# Patient Record
Sex: Female | Born: 1990
Health system: Southern US, Community
[De-identification: ages and names within clinical notes are randomized; demographics above are authoritative.]

## PROBLEM LIST (undated history)

## (undated) DIAGNOSIS — F1410 Cocaine abuse, uncomplicated: Secondary | ICD-10-CM

## (undated) DIAGNOSIS — D649 Anemia, unspecified: Secondary | ICD-10-CM

## (undated) DIAGNOSIS — Z789 Other specified health status: Secondary | ICD-10-CM

## (undated) HISTORY — DX: Anemia, unspecified: D64.9

## (undated) HISTORY — DX: Cocaine abuse, uncomplicated: F14.10

## (undated) HISTORY — DX: Other specified health status: Z78.9

## (undated) HISTORY — PX: NO PAST SURGERIES: SHX2092

---
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
## 2010-02-21 ENCOUNTER — Emergency Department (HOSPITAL_BASED_OUTPATIENT_CLINIC_OR_DEPARTMENT_OTHER): Admission: EM | Admit: 2010-02-21 | Discharge: 2010-02-21 | Payer: Self-pay | Admitting: Emergency Medicine

## 2011-01-23 LAB — URINE MICROSCOPIC-ADD ON

## 2011-01-23 LAB — BASIC METABOLIC PANEL
BUN: 8 mg/dL (ref 6–23)
CO2: 26 mEq/L (ref 19–32)
Calcium: 9.4 mg/dL (ref 8.4–10.5)
Chloride: 106 mEq/L (ref 96–112)
Creatinine, Ser: 0.8 mg/dL (ref 0.4–1.2)
Glucose, Bld: 92 mg/dL (ref 70–99)

## 2011-01-23 LAB — DIFFERENTIAL
Basophils Absolute: 0.1 10*3/uL (ref 0.0–0.1)
Basophils Relative: 1 % (ref 0–1)
Eosinophils Absolute: 0.1 10*3/uL (ref 0.0–0.7)
Monocytes Relative: 6 % (ref 3–12)
Neutro Abs: 5.1 10*3/uL (ref 1.7–7.7)
Neutrophils Relative %: 67 % (ref 43–77)

## 2011-01-23 LAB — POCT TOXICOLOGY PANEL

## 2011-01-23 LAB — CBC
MCHC: 31.9 g/dL (ref 30.0–36.0)
MCV: 76.1 fL — ABNORMAL LOW (ref 78.0–100.0)
Platelets: 280 10*3/uL (ref 150–400)
RDW: 17.5 % — ABNORMAL HIGH (ref 11.5–15.5)

## 2011-01-23 LAB — URINALYSIS, ROUTINE W REFLEX MICROSCOPIC
Glucose, UA: NEGATIVE mg/dL
Ketones, ur: NEGATIVE mg/dL
Nitrite: NEGATIVE
Specific Gravity, Urine: 1.017 (ref 1.005–1.030)
pH: 6.5 (ref 5.0–8.0)

## 2011-07-27 LAB — DIFFERENTIAL
Lymphocytes Relative: 24
Monocytes Absolute: 0.4
Monocytes Relative: 11
Neutro Abs: 2.1

## 2011-07-27 LAB — CBC
HCT: 39
Hemoglobin: 13.6
MCHC: 34.9
RBC: 4.8

## 2011-07-27 LAB — BASIC METABOLIC PANEL
CO2: 20
Calcium: 9.2
Potassium: 3.6
Sodium: 133 — ABNORMAL LOW

## 2011-11-22 ENCOUNTER — Emergency Department (HOSPITAL_BASED_OUTPATIENT_CLINIC_OR_DEPARTMENT_OTHER)
Admission: EM | Admit: 2011-11-22 | Discharge: 2011-11-22 | Disposition: A | Discharge status: Home / Self Care | Payer: Self-pay | Attending: Emergency Medicine | Admitting: Emergency Medicine

## 2011-11-22 ENCOUNTER — Encounter (HOSPITAL_BASED_OUTPATIENT_CLINIC_OR_DEPARTMENT_OTHER): Payer: Self-pay | Admitting: Emergency Medicine

## 2011-11-22 DIAGNOSIS — R109 Unspecified abdominal pain: Secondary | ICD-10-CM | POA: Insufficient documentation

## 2011-11-22 DIAGNOSIS — H9209 Otalgia, unspecified ear: Secondary | ICD-10-CM | POA: Insufficient documentation

## 2011-11-22 DIAGNOSIS — A499 Bacterial infection, unspecified: Secondary | ICD-10-CM | POA: Insufficient documentation

## 2011-11-22 DIAGNOSIS — F17200 Nicotine dependence, unspecified, uncomplicated: Secondary | ICD-10-CM | POA: Insufficient documentation

## 2011-11-22 DIAGNOSIS — B9689 Other specified bacterial agents as the cause of diseases classified elsewhere: Secondary | ICD-10-CM | POA: Insufficient documentation

## 2011-11-22 DIAGNOSIS — N760 Acute vaginitis: Secondary | ICD-10-CM | POA: Insufficient documentation

## 2011-11-22 LAB — COMPREHENSIVE METABOLIC PANEL
ALT: 23 U/L (ref 0–35)
Alkaline Phosphatase: 56 U/L (ref 39–117)
CO2: 25 mEq/L (ref 19–32)
GFR calc Af Amer: >90 mL/min (ref >90)
Glucose, Bld: 90 mg/dL (ref 70–99)
Potassium: 4 mEq/L (ref 3.5–5.1)
Sodium: 141 mEq/L (ref 135–145)
Total Protein: 6.8 g/dL (ref 6.0–8.3)

## 2011-11-22 LAB — URINE MICROSCOPIC-ADD ON

## 2011-11-22 LAB — CBC
Platelets: 226 10*3/uL (ref 150–400)
RBC: 4.94 MIL/uL (ref 3.87–5.11)
WBC: 7.5 10*3/uL (ref 4.0–10.5)

## 2011-11-22 LAB — URINALYSIS, ROUTINE W REFLEX MICROSCOPIC
Glucose, UA: NEGATIVE mg/dL
Specific Gravity, Urine: 1.019 (ref 1.005–1.030)

## 2011-11-22 LAB — DIFFERENTIAL
Eosinophils Absolute: 0.1 10*3/uL (ref 0.0–0.7)
Lymphocytes Relative: 29 % (ref 12–46)
Lymphs Abs: 2.1 10*3/uL (ref 0.7–4.0)
Neutrophils Relative %: 63 % (ref 43–77)

## 2011-11-22 LAB — WET PREP, GENITAL

## 2011-11-22 MED ORDER — PANTOPRAZOLE SODIUM 20 MG PO TBEC: 20.0000 mg | DELAYED_RELEASE_TABLET | Freq: Every day | ORAL | Status: DC

## 2011-11-22 MED ORDER — METRONIDAZOLE 500 MG PO TABS: 500.0000 mg | ORAL_TABLET | Freq: Two times a day (BID) | ORAL | Status: AC

## 2011-11-22 MED ORDER — DICYCLOMINE HCL 20 MG PO TABS: 20.0000 mg | ORAL_TABLET | Freq: Two times a day (BID) | ORAL | Status: DC

## 2011-11-22 MED ORDER — AMOXICILLIN 500 MG PO CAPS: 500.0000 mg | ORAL_CAPSULE | Freq: Three times a day (TID) | ORAL | Status: AC

## 2011-11-22 MED ORDER — SODIUM CHLORIDE 0.9 % IV BOLUS (SEPSIS)
1000.0000 mL | Freq: Once | INTRAVENOUS | Status: AC
  Administered 2011-11-22: 1000 mL via INTRAVENOUS

## 2011-11-22 MED ORDER — GI COCKTAIL ~~LOC~~
30.0000 mL | Freq: Once | ORAL | Status: AC
  Administered 2011-11-22: 30 mL via ORAL
  Filled 2011-11-22: qty 30

## 2011-11-22 MED ORDER — PANTOPRAZOLE SODIUM 40 MG IV SOLR
40.0000 mg | Freq: Once | INTRAVENOUS | Status: AC
  Administered 2011-11-22: 40 mg via INTRAVENOUS
  Filled 2011-11-22: qty 40

## 2011-11-22 NOTE — ED Notes (Signed)
abd pain x 3 days  Denies urinary sx    Denies vag d/c  lmp  1/14

## 2011-11-22 NOTE — ED Provider Notes (Signed)
History     CSN: [id number]  Arrival date & time 11/22/11  1430   3:25 PM HPI This reports abdominal pain for last 3 days. Reports abdominal pain migrates. States sometimes her pain is in her left lower quadrant and sometimes it is located in her epigastrium. Associated with nausea and vomiting yesterday. Denies nausea vomiting or diarrhea today. Denies fever. Reports she is currently on her menstrual cycle. States usually she'll have abdominal cramping with menstrual cycle about the last 1 day. Also states she is here for ear pain that she's had for over one month. States it's gradually worsening. Reports significant history of a ruptured eardrum several months ago and has not followed up with a doctor since. Denies sore throat, ear drainage, hearing loss, neck pain. Patient is a 21 y.o. female presenting with abdominal pain and ear pain. The history is provided by the patient.  Abdominal Pain The primary symptoms of the illness include abdominal pain, nausea, vomiting and vaginal bleeding (Menstrual cycle began 11/19/11). The primary symptoms of the illness do not include fever, shortness of breath, diarrhea, hematemesis, dysuria or vaginal discharge. Episode onset: 3 days. The onset of the illness was gradual. The problem has been gradually worsening.  The pain came on gradually. The abdominal pain is generalized. The abdominal pain does not radiate. The severity of the abdominal pain is 9/10. The abdominal pain is relieved by certain positions.  The patient states that she believes she is currently not pregnant. Symptoms associated with the illness do not include chills, heartburn, urgency, hematuria, frequency or back pain.  Otalgia This is a chronic problem. Episode onset: over 1 month. There is pain in the right ear. The problem occurs constantly. The problem has been gradually worsening. There has been no fever. The pain is severe. Associated symptoms include headaches, abdominal pain and  vomiting. Pertinent negatives include no ear discharge, no rhinorrhea, no sore throat, no diarrhea, no neck pain and no cough.    History reviewed. No pertinent past medical history.  History reviewed. No pertinent past surgical history.  No family history on file.  History  Substance Use Topics  . Smoking status: Current Everyday Smoker    Types: Cigarettes  . Smokeless tobacco: Not on file  . Alcohol Use:      social drinker  2 cigs /day    OB History    Grav Para Term Preterm Abortions TAB SAB Ect Mult Living                  Review of Systems  Constitutional: Positive for appetite change. Negative for fever and chills.  HENT: Positive for ear pain. Negative for sore throat, rhinorrhea, neck pain and ear discharge.   Respiratory: Negative for cough and shortness of breath.   Gastrointestinal: Positive for nausea, vomiting and abdominal pain. Negative for heartburn, diarrhea and hematemesis.  Genitourinary: Positive for vaginal bleeding (Menstrual cycle began 11/19/11). Negative for dysuria, urgency, frequency, hematuria, vaginal discharge and vaginal pain.  Musculoskeletal: Negative for back pain.  Neurological: Positive for headaches.  All other systems reviewed and are negative.    Allergies  Review of patient's allergies indicates no known allergies.  Home Medications  No current outpatient prescriptions on file.  BP 105/91  Pulse 65  Temp(Src) 97.8 F (36.6 C) (Oral)  Resp 18  Ht 5\' 6"  (1.676 m)  Wt 120 lb (54.432 kg)  BMI 19.37 kg/m2  SpO2 100%  Physical Exam  Vitals reviewed. Constitutional: She is oriented  to person, place, and time. Vital signs are normal. She appears well-developed and well-nourished.  HENT:  Head: Normocephalic and atraumatic.  Right Ear: No drainage, swelling or tenderness. Tympanic membrane is scarred and bulging (white fluid behind TM). Tympanic membrane is not perforated and not erythematous. No decreased hearing is noted.    Nose: Nose normal.  Mouth/Throat: Uvula is midline, oropharynx is clear and moist and mucous membranes are normal.  Eyes: Conjunctivae are normal. Pupils are equal, round, and reactive to light.  Neck: Normal range of motion. Neck supple.  Cardiovascular: Normal rate, regular rhythm and normal heart sounds.  Exam reveals no friction rub.   No murmur heard. Pulmonary/Chest: Effort normal and breath sounds normal. She has no wheezes. She has no rhonchi. She has no rales. She exhibits no tenderness.  Abdominal: Normal appearance and bowel sounds are normal. There is no hepatosplenomegaly. There is no tenderness. There is no rigidity, no rebound, no guarding, no CVA tenderness and negative Murphy's sign.  Musculoskeletal: Normal range of motion.  Neurological: She is alert and oriented to person, place, and time. Coordination normal.  Skin: Skin is warm and dry. No rash noted. No erythema. No pallor.    ED Course  Procedures   Results for orders placed during the hospital encounter of 11/22/11  PREGNANCY, URINE      Component Value Range   Preg Test, Ur NEGATIVE    URINALYSIS, ROUTINE W REFLEX MICROSCOPIC      Component Value Range   Color, Urine YELLOW  YELLOW    APPearance CLOUDY (*) CLEAR    Specific Gravity, Urine 1.019  1.005 - 1.030    pH 7.0  5.0 - 8.0    Glucose, UA NEGATIVE  NEGATIVE (mg/dL)   Hgb urine dipstick LARGE (*) NEGATIVE    Bilirubin Urine NEGATIVE  NEGATIVE    Ketones, ur NEGATIVE  NEGATIVE (mg/dL)   Protein, ur NEGATIVE  NEGATIVE (mg/dL)   Urobilinogen, UA 0.2  0.0 - 1.0 (mg/dL)   Nitrite NEGATIVE  NEGATIVE    Leukocytes, UA NEGATIVE  NEGATIVE   URINE MICROSCOPIC-ADD ON      Component Value Range   Squamous Epithelial / LPF RARE  RARE    RBC / HPF TOO NUMEROUS TO COUNT  <3 (RBC/hpf)   Bacteria, UA RARE  RARE   CBC      Component Value Range   WBC 7.5  4.0 - 10.5 (K/uL)   RBC 4.94  3.87 - 5.11 (MIL/uL)   Hemoglobin 13.9  12.0 - 15.0 (g/dL)   HCT 16.1   09.6 - 04.5 (%)   MCV 83.4  78.0 - 100.0 (fL)   MCH 28.1  26.0 - 34.0 (pg)   MCHC 33.7  30.0 - 36.0 (g/dL)   RDW 40.9 (*) 81.1 - 15.5 (%)   Platelets 226  150 - 400 (K/uL)  DIFFERENTIAL      Component Value Range   Neutrophils Relative 63  43 - 77 (%)   Neutro Abs 4.7  1.7 - 7.7 (K/uL)   Lymphocytes Relative 29  12 - 46 (%)   Lymphs Abs 2.1  0.7 - 4.0 (K/uL)   Monocytes Relative 8  3 - 12 (%)   Monocytes Absolute 0.6  0.1 - 1.0 (K/uL)   Eosinophils Relative 1  0 - 5 (%)   Eosinophils Absolute 0.1  0.0 - 0.7 (K/uL)   Basophils Relative 0  0 - 1 (%)   Basophils Absolute 0.0  0.0 - 0.1 (  K/uL)  COMPREHENSIVE METABOLIC PANEL      Component Value Range   Sodium 141  135 - 145 (mEq/L)   Potassium 4.0  3.5 - 5.1 (mEq/L)   Chloride 106  96 - 112 (mEq/L)   CO2 25  19 - 32 (mEq/L)   Glucose, Bld 90  70 - 99 (mg/dL)   BUN 8  6 - 23 (mg/dL)   Creatinine, Ser 0.98  0.50 - 1.10 (mg/dL)   Calcium 9.3  8.4 - 11.9 (mg/dL)   Total Protein 6.8  6.0 - 8.3 (g/dL)   Albumin 4.2  3.5 - 5.2 (g/dL)   AST 23  0 - 37 (U/L)   ALT 23  0 - 35 (U/L)   Alkaline Phosphatase 56  39 - 117 (U/L)   Total Bilirubin 0.3  0.3 - 1.2 (mg/dL)   GFR calc non Af Amer >90  >90 (mL/min)   GFR calc Af Amer >90  >90 (mL/min)  LIPASE, BLOOD      Component Value Range   Lipase 42  11 - 59 (U/L)  WET PREP, GENITAL      Component Value Range   Yeast, Wet Prep NONE SEEN  NONE SEEN    Trich, Wet Prep NONE SEEN  NONE SEEN    Clue Cells, Wet Prep MODERATE (*) NONE SEEN    WBC, Wet Prep HPF POC FEW (*) NONE SEEN     MDM    Patient's symptoms do not appear acute since pain migrates. Will treat for bacterial vaginosis and advise increasing fiber in diet. Advised followup with a primary care physician if symptoms worsen. Will also treat for an ear infection advised followup with ear nose throat physician. Patient agrees to plan and is ready for discharge      Thomasene Lot, PA-C 11/22/11 1624

## 2011-11-22 NOTE — ED Notes (Signed)
Explained pelvic exam procedure to Pt. Before it was done by PA C

## 2011-11-22 NOTE — ED Notes (Signed)
Pt. Reports R ear pain for a few months and has gotten worse in the last wk

## 2011-11-23 LAB — GC/CHLAMYDIA PROBE AMP, GENITAL
Chlamydia, DNA Probe: NEGATIVE
GC Probe Amp, Genital: NEGATIVE

## 2011-11-23 NOTE — ED Provider Notes (Signed)
Medical screening examination/treatment/procedure(s) were performed by non-physician practitioner and as supervising physician I was immediately available for consultation/collaboration.   Gerhard Munch, MD 11/23/11 [phone]

## 2012-03-19 ENCOUNTER — Ambulatory Visit (INDEPENDENT_AMBULATORY_CARE_PROVIDER_SITE_OTHER): Payer: Self-pay | Admitting: Family Medicine

## 2012-03-19 VITALS — BP 122/79 | HR 65 | Temp 98.0°F | Resp 16 | Ht 66.0 in | Wt 120.8 lb

## 2012-03-19 DIAGNOSIS — G4700 Insomnia, unspecified: Secondary | ICD-10-CM

## 2012-03-19 DIAGNOSIS — F341 Dysthymic disorder: Secondary | ICD-10-CM

## 2012-03-19 DIAGNOSIS — F419 Anxiety disorder, unspecified: Secondary | ICD-10-CM

## 2012-03-19 MED ORDER — CLONAZEPAM 0.5 MG PO TABS: 0.5000 mg | ORAL_TABLET | Freq: Two times a day (BID) | ORAL | Status: DC | PRN

## 2012-03-19 MED ORDER — CITALOPRAM HYDROBROMIDE 20 MG PO TABS: 20.0000 mg | ORAL_TABLET | Freq: Every day | ORAL | Status: DC

## 2012-03-19 NOTE — Progress Notes (Signed)
Patient Name: Penny Clark Date of Birth: September 06, 1991 Medical Record Number: [id number] Gender: female Date of Encounter: 03/19/2012  History of Present Illness:  Penny Clark is a 21 y.o. very pleasant female patient who presents with the following:  Here today to discuss insomnia.  She had been on trazodone in the past, but it did not help much.  She also notes that she is irritable and sad, and feels anxious and upset.  She has been feeling bad for at least 6 months.  She cannot pinpoint one specific issue that has stressed her- "it's just a lot of little things."  Penny Clark also notes that her abdomen often feels uncomfortable- can feel too full or too empty.  This problem has also persisted for a few months.    Living in [place]- her family lives here as well.   She is currently looking for work and plans to go back to school this fall for dental hygiene or business administration.   She is generally healthy.   She lives with her boyfriend- the relationship has its ups and downs.   She denies any self- medication but admits that she did take some nyquil last night.  She does not drink to excess or take drugs.    Denies any thoughts of self- harm   LMP 03/10/12  There is no problem list on file for this patient.  No past medical history on file. No past surgical history on file. History  Substance Use Topics  . Smoking status: Current Everyday Smoker    Types: Cigarettes  . Smokeless tobacco: Not on file  . Alcohol Use:      social drinker  2 cigs /day   No family history on file. No Known Allergies  Medication list has been reviewed and updated.  Review of Systems: As per HPI- otherwise negative.  Physical Examination: Filed Vitals:   03/19/12 1537  BP: 122/79  Pulse: 65  Temp: 98 F (36.7 C)  TempSrc: Oral  Resp: 16  Height: 5\' 6"  (1.676 m)  Weight: 120 lb 12.8 oz (54.795 kg)    Body mass index is 19.50 kg/(m^2).  GEN: WDWN, NAD, Non-toxic, A & O x  3 HEENT: Atraumatic, Normocephalic. Neck supple. No masses, No LAD. Ears and Nose: No external deformity. CV: RRR, No M/G/R. No JVD. No thrill. No extra heart sounds. PULM: CTA B, no wheezes, crackles, rhonchi. No retractions. No resp. distress. No accessory muscle use. ABD: S, NT, ND, +BS. No rebound. No HSM. EXTR: No c/c/e NEURO Normal gait.  PSYCH: Normally interactive. Conversant. Got a little tearful during conversation but able to calm herself down easily.     Assessment and Plan: 1. Anxiety and depression  citalopram (CELEXA) 20 MG tablet  2. 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  Discussed in detail and she would like to start medication therapy. Will start celexa at 20mg . Also gave some clonazepam to use for sleep and for daytime anxiety while clexa has a chance to start working. Sicussed risk of dependence and sedation.  Try a half tablet of clonazepam first.    Penny Clark is not using anything for contraception.  She notes that she has had unprotected sex for years and has never gotten pregnant- she does not believe that she can become pregnant which is upsetting to her.  Counseled her that she probably can indeed become pregnant (perhaps with some  assistance) when she chooses to do so.  However, she should not assume that she will not conceive and encouraged her to use condoms if she is not yet ready for pregnancy.

## 2012-03-19 NOTE — Patient Instructions (Signed)
Try a half clonazepam tablet first- this may be enough.   Work on sticking to a routine and exercising daily.   Eat a healthy diet Please call with an update in about 2 weeks- sooner if you feel worse!

## 2012-05-13 ENCOUNTER — Telehealth: Payer: Self-pay

## 2012-05-13 DIAGNOSIS — G4700 Insomnia, unspecified: Secondary | ICD-10-CM

## 2012-05-13 NOTE — Telephone Encounter (Signed)
lmom to cb. 

## 2012-05-13 NOTE — Telephone Encounter (Signed)
PT STATES THAT THE CLONAZEPAM THAT WAS GIVEN TO HER TO HELP HER SLEEP IS NOT WORKING. PT WOULD LIKE TO DISCUSS THIS WITH DR.COPLAND. [phone]

## 2012-05-13 NOTE — Telephone Encounter (Signed)
It looks like it has been almost 2 months since she was seen. She probably should come in for an office visit to discuss with Dr. Patsy Lager. She was also started on Celexa which could be increased, but if several changes are going to be made, an office visit would be the best way to discuss them.

## 2012-05-13 NOTE — Telephone Encounter (Signed)
Pt states the klonopin isn't working like she would like. She wants to know if she can be prescribed something else.

## 2012-05-13 NOTE — Telephone Encounter (Signed)
Pt reports that her insomnia is just not any better. She can't tell a lot of difference in her anxiety level either but her life stressors have changed so it is hard to tell. She will RTC if necessary, but she said that Dr Patsy Lager had told her to call first because she might not need to come in and pay another co-pay. Dr Patsy Lager, do you want to make any changes to her medications, or does she need to RTC first?

## 2012-05-14 MED ORDER — TRAZODONE HCL 50 MG PO TABS: 50.0000 mg | ORAL_TABLET | Freq: Every evening | ORAL | Status: DC | PRN

## 2012-05-14 NOTE — Addendum Note (Signed)
Addended by: Abbe Amsterdam C on: 05/14/2012 02:51 PM   Modules accepted: Orders

## 2012-05-14 NOTE — Telephone Encounter (Signed)
Called- no answer, LMOM.  Will try her back later.

## 2012-05-14 NOTE — Telephone Encounter (Signed)
Penny Clark called back.  She states that the clonazepam did not seem to help her with her insomnia.  She is also taking celexa currently.    She has broken up with her BF, and she feels that her mood is better since this change.   She does not feel depressed.  She feels that she did best with insomnia when she was on trazadone in the past and she would like to go back on this. She states that there is no risk of pregnancy at this time.  Plan: take 1/2 of her celexa tablet (which would equal 10mg ) for 3 days.  Then allow 3 or 4 days for wash out- then start trazadone 50mg  qhs.  May increase to 100mg  after 3 or 4 days.  We may go up to 150 mg (she remembers being on this dose in the past) but I asked her to call me prior to increasing to this dose.  She is ok with this plan and will call me with an update in a few weeks, if not sooner

## 2012-07-12 ENCOUNTER — Other Ambulatory Visit: Payer: Self-pay | Admitting: Family Medicine

## 2012-08-19 ENCOUNTER — Other Ambulatory Visit: Payer: Self-pay | Admitting: Radiology

## 2012-11-05 NOTE — L&D Delivery Note (Signed)
Delivery Note At 5:53 PM a viable female was delivered via Vaginal, Spontaneous Delivery (Presentation: LOA;  ).  APGAR: 6, 10; weight: 3861 gms .   Placenta status: Intact, Spontaneous.  Cord: 3 vessels with the following complications: None .  Cord pH: none  Anesthesia: Epidural  Episiotomy: None Lacerations: 1st degree Suture Repair: 3.0 vicryl Est. Blood Loss (mL): 300  Mom to postpartum.  Baby to nursery-stable.  Kalim Kissel A 07/14/2013, 6:23 PM

## 2013-01-01 LAB — OB RESULTS CONSOLE HGB/HCT, BLOOD: HCT: 38 %

## 2013-01-01 LAB — OB RESULTS CONSOLE PLATELET COUNT: Platelets: 218 10*3/uL

## 2013-01-01 LAB — OB RESULTS CONSOLE ABO/RH: RH Type: POSITIVE

## 2013-01-01 LAB — OB RESULTS CONSOLE ANTIBODY SCREEN: Antibody Screen: NEGATIVE

## 2013-01-21 ENCOUNTER — Other Ambulatory Visit: Payer: Self-pay | Admitting: Obstetrics & Gynecology

## 2013-01-21 DIAGNOSIS — Z369 Encounter for antenatal screening, unspecified: Secondary | ICD-10-CM

## 2013-01-21 DIAGNOSIS — Z3481 Encounter for supervision of other normal pregnancy, first trimester: Secondary | ICD-10-CM

## 2013-02-10 ENCOUNTER — Encounter: Payer: Self-pay | Admitting: *Deleted

## 2013-02-11 ENCOUNTER — Encounter: Payer: Self-pay | Admitting: Obstetrics

## 2013-02-11 ENCOUNTER — Ambulatory Visit (INDEPENDENT_AMBULATORY_CARE_PROVIDER_SITE_OTHER): Payer: Medicaid Other

## 2013-02-11 ENCOUNTER — Ambulatory Visit (INDEPENDENT_AMBULATORY_CARE_PROVIDER_SITE_OTHER): Payer: Medicaid Other | Admitting: Obstetrics

## 2013-02-11 ENCOUNTER — Other Ambulatory Visit: Payer: Medicaid Other

## 2013-02-11 VITALS — BP 121/69 | Temp 98.4°F | Wt 155.4 lb

## 2013-02-11 DIAGNOSIS — Z3400 Encounter for supervision of normal first pregnancy, unspecified trimester: Secondary | ICD-10-CM

## 2013-02-11 DIAGNOSIS — Z369 Encounter for antenatal screening, unspecified: Secondary | ICD-10-CM

## 2013-02-11 DIAGNOSIS — Z3402 Encounter for supervision of normal first pregnancy, second trimester: Secondary | ICD-10-CM

## 2013-02-11 LAB — US OB DETAIL + 14 WK

## 2013-02-11 LAB — POCT URINALYSIS DIPSTICK
Bilirubin, UA: NEGATIVE
Blood, UA: NEGATIVE
Nitrite, UA: NEGATIVE
Spec Grav, UA: 1.015
Urobilinogen, UA: NEGATIVE
pH, UA: 7

## 2013-02-11 NOTE — Progress Notes (Signed)
Pulse: 77

## 2013-02-11 NOTE — Progress Notes (Signed)
Doing well 

## 2013-03-11 ENCOUNTER — Ambulatory Visit (INDEPENDENT_AMBULATORY_CARE_PROVIDER_SITE_OTHER): Payer: Medicaid Other | Admitting: Obstetrics

## 2013-03-11 ENCOUNTER — Encounter: Payer: Self-pay | Admitting: Obstetrics

## 2013-03-11 VITALS — BP 126/79 | Temp 98.1°F | Wt 166.8 lb

## 2013-03-11 DIAGNOSIS — Z3402 Encounter for supervision of normal first pregnancy, second trimester: Secondary | ICD-10-CM

## 2013-03-11 DIAGNOSIS — Z3400 Encounter for supervision of normal first pregnancy, unspecified trimester: Secondary | ICD-10-CM

## 2013-03-11 LAB — POCT URINALYSIS DIPSTICK
Blood, UA: NEGATIVE
Glucose, UA: NEGATIVE
Ketones, UA: NEGATIVE
Spec Grav, UA: 1.015
Urobilinogen, UA: NEGATIVE
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
## 2013-03-11 NOTE — Progress Notes (Signed)
Uterus soft, non tender.

## 2013-03-12 ENCOUNTER — Other Ambulatory Visit: Payer: Medicaid Other | Admitting: *Deleted

## 2013-03-12 ENCOUNTER — Ambulatory Visit (INDEPENDENT_AMBULATORY_CARE_PROVIDER_SITE_OTHER): Payer: Medicaid Other | Admitting: Obstetrics

## 2013-03-12 ENCOUNTER — Encounter: Payer: Self-pay | Admitting: Obstetrics

## 2013-03-12 VITALS — BP 101/67 | Temp 97.9°F | Wt 167.0 lb

## 2013-03-12 DIAGNOSIS — Z3400 Encounter for supervision of normal first pregnancy, unspecified trimester: Secondary | ICD-10-CM

## 2013-03-12 DIAGNOSIS — Z3402 Encounter for supervision of normal first pregnancy, second trimester: Secondary | ICD-10-CM

## 2013-03-12 LAB — POCT URINALYSIS DIPSTICK
Bilirubin, UA: NEGATIVE
Glucose, UA: NEGATIVE
Ketones, UA: NEGATIVE
Leukocytes, UA: NEGATIVE
Protein, UA: NEGATIVE
Spec Grav, UA: 1.01

## 2013-03-12 NOTE — Progress Notes (Signed)
P 78 Pt reports she is doing well

## 2013-03-13 LAB — RPR

## 2013-03-13 LAB — CBC
MCV: 88.2 fL (ref 78.0–100.0)
Platelets: 204 10*3/uL (ref 150–400)
RBC: 4.31 MIL/uL (ref 3.87–5.11)
RDW: 13.5 % (ref 11.5–15.5)
WBC: 8.6 10*3/uL (ref 4.0–10.5)

## 2013-03-13 LAB — GLUCOSE TOLERANCE, 2 HOURS W/ 1HR
Glucose, 1 hour: 64 mg/dL — ABNORMAL LOW (ref 70–170)
Glucose, 2 hour: 69 mg/dL — ABNORMAL LOW (ref 70–139)
Glucose, Fasting: 61 mg/dL — ABNORMAL LOW (ref 70–99)

## 2013-03-13 LAB — HIV ANTIBODY (ROUTINE TESTING W REFLEX): HIV: NONREACTIVE

## 2013-03-31 ENCOUNTER — Ambulatory Visit (INDEPENDENT_AMBULATORY_CARE_PROVIDER_SITE_OTHER): Payer: Medicaid Other | Admitting: Obstetrics

## 2013-03-31 VITALS — BP 112/72 | Temp 98.0°F | Wt 175.0 lb

## 2013-03-31 DIAGNOSIS — Z3400 Encounter for supervision of normal first pregnancy, unspecified trimester: Secondary | ICD-10-CM

## 2013-03-31 DIAGNOSIS — Z3402 Encounter for supervision of normal first pregnancy, second trimester: Secondary | ICD-10-CM

## 2013-03-31 LAB — POCT URINALYSIS DIPSTICK
Ketones, UA: NEGATIVE
Leukocytes, UA: NEGATIVE
Protein, UA: NEGATIVE
Urobilinogen, UA: NEGATIVE
pH, UA: 7

## 2013-03-31 NOTE — Progress Notes (Signed)
P 70 Patient reports she is doing well.

## 2013-04-01 ENCOUNTER — Encounter: Payer: Self-pay | Admitting: Obstetrics

## 2013-04-06 ENCOUNTER — Encounter: Payer: Self-pay | Admitting: Obstetrics & Gynecology

## 2013-04-13 ENCOUNTER — Ambulatory Visit (INDEPENDENT_AMBULATORY_CARE_PROVIDER_SITE_OTHER): Payer: Medicaid Other | Admitting: Obstetrics

## 2013-04-13 ENCOUNTER — Encounter: Payer: Self-pay | Admitting: Obstetrics

## 2013-04-13 VITALS — BP 120/75 | Temp 97.4°F | Wt 179.0 lb

## 2013-04-13 DIAGNOSIS — Z3402 Encounter for supervision of normal first pregnancy, second trimester: Secondary | ICD-10-CM

## 2013-04-13 DIAGNOSIS — Z3400 Encounter for supervision of normal first pregnancy, unspecified trimester: Secondary | ICD-10-CM

## 2013-04-13 LAB — POCT URINALYSIS DIPSTICK
Glucose, UA: NEGATIVE
Ketones, UA: NEGATIVE
Leukocytes, UA: NEGATIVE
Protein, UA: NEGATIVE
Urobilinogen, UA: NEGATIVE

## 2013-04-13 NOTE — Progress Notes (Signed)
P 73 Patient reports she is having some discomfort at times - ligament pain

## 2013-04-27 ENCOUNTER — Encounter: Payer: Self-pay | Admitting: Obstetrics

## 2013-04-27 ENCOUNTER — Ambulatory Visit (INDEPENDENT_AMBULATORY_CARE_PROVIDER_SITE_OTHER): Payer: Medicaid Other | Admitting: Obstetrics

## 2013-04-27 VITALS — BP 102/52 | Temp 97.5°F | Wt 185.0 lb

## 2013-04-27 DIAGNOSIS — O365931 Maternal care for other known or suspected poor fetal growth, third trimester, fetus 1: Secondary | ICD-10-CM

## 2013-04-27 DIAGNOSIS — O365990 Maternal care for other known or suspected poor fetal growth, unspecified trimester, not applicable or unspecified: Secondary | ICD-10-CM

## 2013-04-27 DIAGNOSIS — Z3403 Encounter for supervision of normal first pregnancy, third trimester: Secondary | ICD-10-CM

## 2013-04-27 DIAGNOSIS — Z3400 Encounter for supervision of normal first pregnancy, unspecified trimester: Secondary | ICD-10-CM

## 2013-04-27 LAB — POCT URINALYSIS DIPSTICK
Bilirubin, UA: NEGATIVE
Nitrite, UA: NEGATIVE
pH, UA: 5

## 2013-04-27 NOTE — Progress Notes (Signed)
Pulse-83 Pt c/o intermittent lower back pain x 2 weeks.

## 2013-05-04 ENCOUNTER — Inpatient Hospital Stay (HOSPITAL_COMMUNITY): Admission: AD | Admit: 2013-05-04 | Payer: Self-pay | Source: Ambulatory Visit | Admitting: Obstetrics

## 2013-05-05 ENCOUNTER — Ambulatory Visit (INDEPENDENT_AMBULATORY_CARE_PROVIDER_SITE_OTHER): Payer: Medicaid Other

## 2013-05-05 ENCOUNTER — Encounter: Payer: Self-pay | Admitting: Obstetrics & Gynecology

## 2013-05-05 DIAGNOSIS — O365931 Maternal care for other known or suspected poor fetal growth, third trimester, fetus 1: Secondary | ICD-10-CM

## 2013-05-05 DIAGNOSIS — Z3400 Encounter for supervision of normal first pregnancy, unspecified trimester: Secondary | ICD-10-CM

## 2013-05-05 LAB — US OB DETAIL + 14 WK

## 2013-05-07 ENCOUNTER — Encounter: Payer: Self-pay | Admitting: Obstetrics

## 2013-05-07 LAB — US OB DETAIL + 14 WK

## 2013-05-11 ENCOUNTER — Ambulatory Visit (INDEPENDENT_AMBULATORY_CARE_PROVIDER_SITE_OTHER): Payer: Medicaid Other | Admitting: Obstetrics

## 2013-05-11 ENCOUNTER — Encounter: Payer: Self-pay | Admitting: Obstetrics

## 2013-05-11 VITALS — BP 134/74 | Temp 97.9°F | Wt 190.0 lb

## 2013-05-11 DIAGNOSIS — Z3400 Encounter for supervision of normal first pregnancy, unspecified trimester: Secondary | ICD-10-CM

## 2013-05-11 DIAGNOSIS — Z3403 Encounter for supervision of normal first pregnancy, third trimester: Secondary | ICD-10-CM

## 2013-05-11 LAB — POCT URINALYSIS DIPSTICK
Bilirubin, UA: NEGATIVE
Glucose, UA: NEGATIVE
Ketones, UA: NEGATIVE
Leukocytes, UA: NEGATIVE
Nitrite, UA: NEGATIVE

## 2013-05-11 NOTE — Progress Notes (Signed)
Pulse-82 No complaints.

## 2013-05-21 ENCOUNTER — Encounter: Payer: Self-pay | Admitting: Obstetrics

## 2013-05-21 LAB — US OB DETAIL + 14 WK

## 2013-05-25 ENCOUNTER — Encounter: Payer: Self-pay | Admitting: Obstetrics

## 2013-05-25 ENCOUNTER — Ambulatory Visit (INDEPENDENT_AMBULATORY_CARE_PROVIDER_SITE_OTHER): Payer: Medicaid Other | Admitting: Obstetrics

## 2013-05-25 VITALS — BP 124/77 | Temp 97.9°F | Wt 192.0 lb

## 2013-05-25 DIAGNOSIS — Z3403 Encounter for supervision of normal first pregnancy, third trimester: Secondary | ICD-10-CM

## 2013-05-25 DIAGNOSIS — Z3400 Encounter for supervision of normal first pregnancy, unspecified trimester: Secondary | ICD-10-CM

## 2013-05-25 NOTE — Progress Notes (Signed)
Pulse: 84

## 2013-06-01 ENCOUNTER — Ambulatory Visit (INDEPENDENT_AMBULATORY_CARE_PROVIDER_SITE_OTHER): Payer: Medicaid Other | Admitting: Obstetrics

## 2013-06-01 ENCOUNTER — Encounter: Payer: Self-pay | Admitting: Obstetrics

## 2013-06-01 VITALS — BP 133/74 | Temp 97.0°F | Wt 195.0 lb

## 2013-06-01 DIAGNOSIS — Z3400 Encounter for supervision of normal first pregnancy, unspecified trimester: Secondary | ICD-10-CM

## 2013-06-01 DIAGNOSIS — Z3403 Encounter for supervision of normal first pregnancy, third trimester: Secondary | ICD-10-CM

## 2013-06-01 LAB — POCT URINALYSIS DIPSTICK
Bilirubin, UA: NEGATIVE
Blood, UA: NEGATIVE
Glucose, UA: NEGATIVE
Ketones, UA: NEGATIVE
pH, UA: 7

## 2013-06-01 NOTE — Progress Notes (Signed)
Pulse-75 No complaints.  

## 2013-06-08 ENCOUNTER — Encounter: Payer: Self-pay | Admitting: Obstetrics

## 2013-06-08 ENCOUNTER — Ambulatory Visit (INDEPENDENT_AMBULATORY_CARE_PROVIDER_SITE_OTHER): Payer: Medicaid Other | Admitting: Obstetrics

## 2013-06-08 VITALS — BP 129/69 | Temp 97.1°F | Wt 196.0 lb

## 2013-06-08 DIAGNOSIS — Z3480 Encounter for supervision of other normal pregnancy, unspecified trimester: Secondary | ICD-10-CM

## 2013-06-08 DIAGNOSIS — Z3483 Encounter for supervision of other normal pregnancy, third trimester: Secondary | ICD-10-CM

## 2013-06-08 LAB — POCT URINALYSIS DIPSTICK
Bilirubin, UA: NEGATIVE
Ketones, UA: NEGATIVE
Spec Grav, UA: 1.015
pH, UA: 7

## 2013-06-08 NOTE — Progress Notes (Signed)
Pulse-80 No complaints.

## 2013-06-15 ENCOUNTER — Encounter: Payer: Self-pay | Admitting: Obstetrics

## 2013-06-15 ENCOUNTER — Ambulatory Visit (INDEPENDENT_AMBULATORY_CARE_PROVIDER_SITE_OTHER): Payer: Medicaid Other | Admitting: Obstetrics

## 2013-06-15 VITALS — BP 123/74 | Temp 97.7°F | Wt 198.0 lb

## 2013-06-15 DIAGNOSIS — Z3403 Encounter for supervision of normal first pregnancy, third trimester: Secondary | ICD-10-CM

## 2013-06-15 DIAGNOSIS — Z3400 Encounter for supervision of normal first pregnancy, unspecified trimester: Secondary | ICD-10-CM

## 2013-06-15 LAB — POCT URINALYSIS DIPSTICK
Glucose, UA: NEGATIVE
Leukocytes, UA: NEGATIVE
Spec Grav, UA: 1.025
Urobilinogen, UA: NEGATIVE

## 2013-06-15 NOTE — Progress Notes (Signed)
Pulse-95 No complaints. 

## 2013-06-22 ENCOUNTER — Ambulatory Visit (INDEPENDENT_AMBULATORY_CARE_PROVIDER_SITE_OTHER): Payer: Medicaid Other | Admitting: Obstetrics

## 2013-06-22 ENCOUNTER — Encounter: Payer: Self-pay | Admitting: Obstetrics

## 2013-06-22 VITALS — BP 119/75 | Temp 98.3°F | Wt 198.6 lb

## 2013-06-22 DIAGNOSIS — Z3400 Encounter for supervision of normal first pregnancy, unspecified trimester: Secondary | ICD-10-CM

## 2013-06-22 DIAGNOSIS — Z3403 Encounter for supervision of normal first pregnancy, third trimester: Secondary | ICD-10-CM

## 2013-06-22 LAB — POCT URINALYSIS DIPSTICK
Ketones, UA: NEGATIVE
Leukocytes, UA: NEGATIVE
Nitrite, UA: NEGATIVE
Urobilinogen, UA: NEGATIVE
pH, UA: 6

## 2013-06-22 NOTE — Progress Notes (Signed)
Pulse: 93

## 2013-06-29 ENCOUNTER — Ambulatory Visit (INDEPENDENT_AMBULATORY_CARE_PROVIDER_SITE_OTHER): Payer: Medicaid Other | Admitting: Obstetrics

## 2013-06-29 ENCOUNTER — Encounter: Payer: Self-pay | Admitting: Obstetrics

## 2013-06-29 VITALS — BP 122/77 | Temp 97.9°F | Wt 200.0 lb

## 2013-06-29 DIAGNOSIS — Z3400 Encounter for supervision of normal first pregnancy, unspecified trimester: Secondary | ICD-10-CM

## 2013-06-29 DIAGNOSIS — Z3403 Encounter for supervision of normal first pregnancy, third trimester: Secondary | ICD-10-CM

## 2013-06-29 LAB — POCT URINALYSIS DIPSTICK
Blood, UA: NEGATIVE
Leukocytes, UA: NEGATIVE
Nitrite, UA: NEGATIVE
pH, UA: 5

## 2013-06-29 NOTE — Progress Notes (Signed)
P-82 

## 2013-07-07 ENCOUNTER — Telehealth (HOSPITAL_COMMUNITY): Payer: Self-pay | Admitting: *Deleted

## 2013-07-07 ENCOUNTER — Ambulatory Visit (INDEPENDENT_AMBULATORY_CARE_PROVIDER_SITE_OTHER): Payer: Medicaid Other | Admitting: Obstetrics

## 2013-07-07 VITALS — BP 124/84 | Temp 98.3°F | Wt 201.6 lb

## 2013-07-07 DIAGNOSIS — Z3403 Encounter for supervision of normal first pregnancy, third trimester: Secondary | ICD-10-CM

## 2013-07-07 DIAGNOSIS — Z3400 Encounter for supervision of normal first pregnancy, unspecified trimester: Secondary | ICD-10-CM

## 2013-07-07 LAB — POCT URINALYSIS DIPSTICK
Spec Grav, UA: 1.015
pH, UA: 7

## 2013-07-07 NOTE — Progress Notes (Signed)
Pulse: 78

## 2013-07-07 NOTE — Telephone Encounter (Signed)
Preadmission screen  

## 2013-07-09 ENCOUNTER — Encounter: Payer: Self-pay | Admitting: Obstetrics

## 2013-07-13 ENCOUNTER — Inpatient Hospital Stay (HOSPITAL_COMMUNITY)
Admission: RE | Admit: 2013-07-13 | Discharge: 2013-07-16 | DRG: 775 | Disposition: A | Discharge status: Home / Self Care | Payer: Medicaid Other | Source: Ambulatory Visit | Attending: Obstetrics | Admitting: Obstetrics

## 2013-07-13 ENCOUNTER — Encounter (HOSPITAL_COMMUNITY): Payer: Self-pay

## 2013-07-13 DIAGNOSIS — O480 Post-term pregnancy: Principal | ICD-10-CM | POA: Diagnosis present

## 2013-07-13 DIAGNOSIS — Z789 Other specified health status: Secondary | ICD-10-CM

## 2013-07-13 LAB — CBC
HCT: 39 % (ref 36.0–46.0)
Hemoglobin: 12.9 g/dL (ref 12.0–15.0)
MCH: 27.5 pg (ref 26.0–34.0)
MCHC: 33.1 g/dL (ref 30.0–36.0)

## 2013-07-13 MED ORDER — TERBUTALINE SULFATE 1 MG/ML IJ SOLN: 0.2500 mg | Freq: Once | INTRAMUSCULAR | Status: AC | PRN

## 2013-07-13 MED ORDER — LACTATED RINGERS IV SOLN
500.0000 mL | INTRAVENOUS | Status: DC | PRN
Start: 2013-07-13 — End: 2013-07-14

## 2013-07-13 MED ORDER — OXYCODONE-ACETAMINOPHEN 5-325 MG PO TABS: 1.0000 | ORAL_TABLET | ORAL | Status: DC | PRN

## 2013-07-13 MED ORDER — IBUPROFEN 600 MG PO TABS
600.0000 mg | ORAL_TABLET | Freq: Four times a day (QID) | ORAL | Status: DC | PRN
  Administered 2013-07-14: 600 mg via ORAL
  Filled 2013-07-13: qty 1

## 2013-07-13 MED ORDER — MISOPROSTOL 25 MCG QUARTER TABLET
25.0000 ug | ORAL_TABLET | ORAL | Status: AC
  Administered 2013-07-14 (×2): 25 ug via VAGINAL
  Filled 2013-07-13 (×2): qty 0.25

## 2013-07-13 MED ORDER — NALBUPHINE SYRINGE 5 MG/0.5 ML
10.0000 mg | INJECTION | Freq: Four times a day (QID) | INTRAMUSCULAR | Status: DC | PRN
  Administered 2013-07-13: 10 mg via INTRAMUSCULAR
  Filled 2013-07-13 (×2): qty 1

## 2013-07-13 MED ORDER — PROMETHAZINE HCL 25 MG/ML IJ SOLN: 25.0000 mg | Freq: Four times a day (QID) | INTRAMUSCULAR | Status: DC | PRN

## 2013-07-13 MED ORDER — OXYTOCIN BOLUS FROM INFUSION: 500.0000 mL | INTRAVENOUS | Status: DC

## 2013-07-13 MED ORDER — CITRIC ACID-SODIUM CITRATE 334-500 MG/5ML PO SOLN: 30.0000 mL | ORAL | Status: DC | PRN

## 2013-07-13 MED ORDER — LACTATED RINGERS IV SOLN
INTRAVENOUS | Status: DC
  Administered 2013-07-13 (×2): via INTRAVENOUS

## 2013-07-13 MED ORDER — NALBUPHINE HCL 10 MG/ML IJ SOLN
10.0000 mg | INTRAMUSCULAR | Status: DC | PRN
  Filled 2013-07-13: qty 1

## 2013-07-13 MED ORDER — OXYTOCIN 40 UNITS IN LACTATED RINGERS INFUSION - SIMPLE MED
1.0000 m[IU]/min | INTRAVENOUS | Status: DC
  Administered 2013-07-13: 2 m[IU]/min via INTRAVENOUS
  Filled 2013-07-13: qty 1000

## 2013-07-13 MED ORDER — NALBUPHINE HCL 10 MG/ML IJ SOLN
10.0000 mg | Freq: Four times a day (QID) | INTRAMUSCULAR | Status: DC | PRN
  Filled 2013-07-13: qty 1

## 2013-07-13 MED ORDER — NALBUPHINE SYRINGE 5 MG/0.5 ML
10.0000 mg | INJECTION | INTRAMUSCULAR | Status: DC | PRN
  Administered 2013-07-13: 10 mg via INTRAVENOUS
  Filled 2013-07-13 (×2): qty 1

## 2013-07-13 MED ORDER — LIDOCAINE HCL (PF) 1 % IJ SOLN
30.0000 mL | INTRAMUSCULAR | Status: DC | PRN
  Filled 2013-07-13: qty 30

## 2013-07-13 MED ORDER — ONDANSETRON HCL 4 MG/2ML IJ SOLN: 4.0000 mg | Freq: Four times a day (QID) | INTRAMUSCULAR | Status: DC | PRN

## 2013-07-13 MED ORDER — OXYTOCIN 40 UNITS IN LACTATED RINGERS INFUSION - SIMPLE MED: 62.5000 mL/h | INTRAVENOUS | Status: DC

## 2013-07-13 MED ORDER — ACETAMINOPHEN 325 MG PO TABS: 650.0000 mg | ORAL_TABLET | ORAL | Status: DC | PRN

## 2013-07-13 NOTE — H&P (Signed)
Penny Clark is a 22 y.o. female presenting for IOL for postdates. Maternal Medical History:  Reason for admission: 22 yo G1.  EDC 07-06-13.  Presents for IOL for postdates.  Fetal activity: Perceived fetal activity is normal.   Last perceived fetal movement was within the past hour.    Prenatal complications: no prenatal complications Prenatal Complications - Diabetes: none.    OB History   Grav Para Term Preterm Abortions TAB SAB Ect Mult Living   1              Past Medical History  Diagnosis Date  . Anemia    Past Surgical History  Procedure Laterality Date  . No past surgeries     Family History: family history includes Cancer in her maternal grandfather. Social History:  reports that she has quit smoking. Her smoking use included Cigarettes. She smoked 0.00 packs per day. She does not have any smokeless tobacco history on file. She reports that she does not drink alcohol or use illicit drugs.   Prenatal Transfer Tool  Maternal Diabetes: No Genetic Screening: Normal Maternal Ultrasounds/Referrals: Normal Fetal Ultrasounds or other Referrals:  None Maternal Substance Abuse:  No Significant Maternal Medications:  None Significant Maternal Lab Results:  None Other Comments:  None  Review of Systems  All other systems reviewed and are negative.      Blood pressure 134/74, pulse 107, temperature 97.3 F (36.3 C), temperature source Oral, resp. rate 18, height 5\' 6"  (1.676 m), weight 201 lb (91.173 kg), last menstrual period 09/29/2012. Maternal Exam:  Uterine Assessment: Contraction frequency is irregular.   Abdomen: Patient reports no abdominal tenderness. Fetal presentation: vertex  Introitus: Normal vulva. Normal vagina.  Pelvis: adequate for delivery.   Cervix: Cervix evaluated by digital exam.     Physical Exam  Nursing note and vitals reviewed. Constitutional: She is oriented to person, place, and time. She appears well-developed and well-nourished.   HENT:  Head: Normocephalic and atraumatic.  Eyes: Conjunctivae are normal. Pupils are equal, round, and reactive to light.  Neck: Normal range of motion. Neck supple.  Cardiovascular: Normal rate and regular rhythm.   Respiratory: Effort normal.  GI: Soft.  Genitourinary: Vagina normal and uterus normal.  Musculoskeletal: Normal range of motion.  Neurological: She is alert and oriented to person, place, and time.  Skin: Skin is warm and dry.  Psychiatric: She has a normal mood and affect. Her behavior is normal. Judgment and thought content normal.    Prenatal labs: ABO, Rh: B/Positive/-- (02/27 0000) Antibody: Negative (02/27 0000) Rubella: Nonimmune (02/27 0000) RPR: NON REAC (05/08 1004)  HBsAg: Negative (02/27 0000)  HIV: NON REACTIVE (05/08 1004)  GBS: NEGATIVE (07/28 1332)   Assessment/Plan: 41 weeks.  IOL for postdates.  Favorable cervix.  Low dose pitocin per protocol.   Caylan Chenard A 07/13/2013, 9:00 AM

## 2013-07-14 ENCOUNTER — Encounter (HOSPITAL_COMMUNITY): Payer: Self-pay

## 2013-07-14 ENCOUNTER — Encounter (HOSPITAL_COMMUNITY): Payer: Self-pay | Admitting: Anesthesiology

## 2013-07-14 ENCOUNTER — Inpatient Hospital Stay (HOSPITAL_COMMUNITY): Payer: Medicaid Other | Admitting: Anesthesiology

## 2013-07-14 MED ORDER — ZOLPIDEM TARTRATE 5 MG PO TABS: 5.0000 mg | ORAL_TABLET | Freq: Every evening | ORAL | Status: DC | PRN

## 2013-07-14 MED ORDER — DIPHENHYDRAMINE HCL 50 MG/ML IJ SOLN
12.5000 mg | INTRAMUSCULAR | Status: DC | PRN
  Administered 2013-07-14: 12.5 mg via INTRAVENOUS
  Filled 2013-07-14: qty 1

## 2013-07-14 MED ORDER — LIDOCAINE HCL (PF) 1 % IJ SOLN
INTRAMUSCULAR | Status: DC | PRN
  Administered 2013-07-14 (×2): 5 mL

## 2013-07-14 MED ORDER — CITRIC ACID-SODIUM CITRATE 334-500 MG/5ML PO SOLN
ORAL | Status: AC
  Filled 2013-07-14: qty 15

## 2013-07-14 MED ORDER — SENNOSIDES-DOCUSATE SODIUM 8.6-50 MG PO TABS: 2.0000 | ORAL_TABLET | ORAL | Status: DC

## 2013-07-14 MED ORDER — PHENYLEPHRINE 40 MCG/ML (10ML) SYRINGE FOR IV PUSH (FOR BLOOD PRESSURE SUPPORT)
80.0000 ug | PREFILLED_SYRINGE | INTRAVENOUS | Status: DC | PRN
  Filled 2013-07-14: qty 2
  Filled 2013-07-14: qty 5

## 2013-07-14 MED ORDER — BENZOCAINE-MENTHOL 20-0.5 % EX AERO: 1.0000 "application " | INHALATION_SPRAY | CUTANEOUS | Status: DC | PRN

## 2013-07-14 MED ORDER — FENTANYL 2.5 MCG/ML BUPIVACAINE 1/10 % EPIDURAL INFUSION (WH - ANES)
14.0000 mL/h | INTRAMUSCULAR | Status: DC | PRN
  Administered 2013-07-14: 14 mL/h via EPIDURAL
  Filled 2013-07-14: qty 125

## 2013-07-14 MED ORDER — PHENYLEPHRINE 40 MCG/ML (10ML) SYRINGE FOR IV PUSH (FOR BLOOD PRESSURE SUPPORT)
80.0000 ug | PREFILLED_SYRINGE | INTRAVENOUS | Status: DC | PRN
  Filled 2013-07-14: qty 2

## 2013-07-14 MED ORDER — DIBUCAINE 1 % RE OINT: 1.0000 "application " | TOPICAL_OINTMENT | RECTAL | Status: DC | PRN

## 2013-07-14 MED ORDER — ZOLPIDEM TARTRATE 5 MG PO TABS
5.0000 mg | ORAL_TABLET | Freq: Every evening | ORAL | Status: DC | PRN
  Administered 2013-07-14: 5 mg via ORAL
  Filled 2013-07-14: qty 1

## 2013-07-14 MED ORDER — WITCH HAZEL-GLYCERIN EX PADS: 1.0000 "application " | MEDICATED_PAD | CUTANEOUS | Status: DC | PRN

## 2013-07-14 MED ORDER — LANOLIN HYDROUS EX OINT: TOPICAL_OINTMENT | CUTANEOUS | Status: DC | PRN

## 2013-07-14 MED ORDER — TETANUS-DIPHTH-ACELL PERTUSSIS 5-2.5-18.5 LF-MCG/0.5 IM SUSP
0.5000 mL | Freq: Once | INTRAMUSCULAR | Status: AC
  Administered 2013-07-16: 0.5 mL via INTRAMUSCULAR
  Filled 2013-07-14: qty 0.5

## 2013-07-14 MED ORDER — ONDANSETRON HCL 4 MG/2ML IJ SOLN: 4.0000 mg | INTRAMUSCULAR | Status: DC | PRN

## 2013-07-14 MED ORDER — MEDROXYPROGESTERONE ACETATE 150 MG/ML IM SUSP: 150.0000 mg | INTRAMUSCULAR | Status: DC | PRN

## 2013-07-14 MED ORDER — OXYCODONE-ACETAMINOPHEN 5-325 MG PO TABS: 1.0000 | ORAL_TABLET | ORAL | Status: DC | PRN

## 2013-07-14 MED ORDER — IBUPROFEN 600 MG PO TABS
600.0000 mg | ORAL_TABLET | Freq: Four times a day (QID) | ORAL | Status: DC
  Administered 2013-07-15 – 2013-07-16 (×7): 600 mg via ORAL
  Filled 2013-07-14 (×7): qty 1

## 2013-07-14 MED ORDER — FENTANYL CITRATE 0.05 MG/ML IJ SOLN
100.0000 ug | INTRAMUSCULAR | Status: DC | PRN
  Administered 2013-07-14: 100 ug via INTRAVENOUS
  Filled 2013-07-14: qty 2

## 2013-07-14 MED ORDER — OXYTOCIN 40 UNITS IN LACTATED RINGERS INFUSION - SIMPLE MED
1.0000 m[IU]/min | INTRAVENOUS | Status: DC
  Administered 2013-07-14: 2 m[IU]/min via INTRAVENOUS
  Filled 2013-07-14: qty 1000

## 2013-07-14 MED ORDER — EPHEDRINE 5 MG/ML INJ
10.0000 mg | INTRAVENOUS | Status: DC | PRN
  Filled 2013-07-14: qty 4
  Filled 2013-07-14: qty 2

## 2013-07-14 MED ORDER — SIMETHICONE 80 MG PO CHEW: 80.0000 mg | CHEWABLE_TABLET | ORAL | Status: DC | PRN

## 2013-07-14 MED ORDER — DIPHENHYDRAMINE HCL 25 MG PO CAPS: 25.0000 mg | ORAL_CAPSULE | Freq: Four times a day (QID) | ORAL | Status: DC | PRN

## 2013-07-14 MED ORDER — ONDANSETRON HCL 4 MG PO TABS: 4.0000 mg | ORAL_TABLET | ORAL | Status: DC | PRN

## 2013-07-14 MED ORDER — PRENATAL MULTIVITAMIN CH
1.0000 | ORAL_TABLET | Freq: Every day | ORAL | Status: DC
  Administered 2013-07-15: 1 via ORAL
  Filled 2013-07-14: qty 1

## 2013-07-14 MED ORDER — OXYTOCIN 40 UNITS IN LACTATED RINGERS INFUSION - SIMPLE MED: 62.5000 mL/h | INTRAVENOUS | Status: DC | PRN

## 2013-07-14 MED ORDER — EPHEDRINE 5 MG/ML INJ
10.0000 mg | INTRAVENOUS | Status: DC | PRN
  Filled 2013-07-14: qty 2

## 2013-07-14 MED ORDER — LACTATED RINGERS IV SOLN: 500.0000 mL | Freq: Once | INTRAVENOUS | Status: DC

## 2013-07-14 NOTE — Progress Notes (Signed)
Questionable variable/prolonged deceleration. Upon entering the room patient was repositioning self in bed & FHT had returned to baseline. Pulse ox applied to determine maternal heart rate 70-80 BPM, consistent with nadir of FHT deceleration.

## 2013-07-14 NOTE — Anesthesia Procedure Notes (Signed)
Epidural Patient location during procedure: OB Start time: 07/14/2013 10:16 AM  Staffing Anesthesiologist: Angus Seller., Harrell Gave. Performed by: anesthesiologist   Preanesthetic Checklist Completed: patient identified, site marked, surgical consent, pre-op evaluation, timeout performed, IV checked, risks and benefits discussed and monitors and equipment checked  Epidural Patient position: sitting Prep: site prepped and draped and DuraPrep Patient monitoring: continuous pulse ox and blood pressure Approach: midline Injection technique: LOR air  Needle:  Needle type: Tuohy  Needle gauge: 17 G Needle length: 9 cm and 9 Needle insertion depth: 5 cm cm Catheter type: closed end flexible Catheter size: 19 Gauge Catheter at skin depth: 10 cm Test dose: negative  Assessment Events: blood not aspirated, injection not painful, no injection resistance, negative IV test and no paresthesia  Additional Notes Patient identified.  Risk benefits discussed including failed block, incomplete pain control, headache, nerve damage, paralysis, blood pressure changes, nausea, vomiting, reactions to medication both toxic or allergic, and postpartum back pain.  Patient expressed understanding and wished to proceed.  All questions were answered.  Sterile technique used throughout procedure and epidural site dressed with sterile barrier dressing. No paresthesia or other complications noted.The patient did not experience any signs of intravascular injection such as tinnitus or metallic taste in mouth nor signs of intrathecal spread such as rapid motor block. Please see nursing notes for vital signs.

## 2013-07-14 NOTE — Progress Notes (Addendum)
Penny Clark is a 22 y.o. G1P0 at [redacted]w[redacted]d by LMP admitted for induction of labor due to Post dates. Due date 07/06/2013.  Subjective:  Comfortable w/ CLE.  Objective: BP 131/75  Pulse 77  Temp(Src) 98.7 F (37.1 C) (Oral)  Resp 16  Ht 5\' 6"  (1.676 m)  Wt 201 lb (91.173 kg)  BMI 32.46 kg/m2  SpO2 100%  LMP 09/29/2012      FHT:  FHR: 145 bpm, variability: moderate,  accelerations:  Present,  decelerations:  Present Variables, occasional lates. UC:   regular, every 2-3 minutes SVE:   Dilation: Lip/rim Effacement (%): 100 Station: +1 Exam by:: Penny Clark CNM  Labs: Lab Results  Component Value Date   WBC 10.3 07/13/2013   HGB 12.9 07/13/2013   HCT 39.0 07/13/2013   MCV 83.2 07/13/2013   PLT 185 07/13/2013    Assessment / Plan: Induction of labor due to term with favorable cervix,  progressing well without pitocin. Passive Descent, anticipate lower station    Labor: Progressing normally, pitocin off Preeclampsia:  no signs or symptoms of toxicity Fetal Wellbeing:  Category II Pain Control:  Epidural I/D:  n/a Anticipated MOD:  NSVD  Penny Clark 07/14/2013, 3:23 PM

## 2013-07-14 NOTE — Progress Notes (Addendum)
Penny Clark is a 22 y.o. G1P0 at [redacted]w[redacted]d by LMP admitted for induction of labor due to Post dates. Due date 07/06/2013.  Subjective:  Comfortable w/ CLE   Objective: BP 133/79  Pulse 85  Temp(Src) 98.6 F (37 C) (Oral)  Resp 16  Ht 5\' 6"  (1.676 m)  Wt 201 lb (91.173 kg)  BMI 32.46 kg/m2  SpO2 96%  LMP 09/29/2012      FHT:  FHR: 135 bpm, variability: moderate,  accelerations:  Present,  decelerations:  Present Variables UC:   regular, every 3 minutes SVE:   Dilation: 8 Effacement (%): 100 Station: +1 Exam by:: lee  Labs: Lab Results  Component Value Date   WBC 10.3 07/13/2013   HGB 12.9 07/13/2013   HCT 39.0 07/13/2013   MCV 83.2 07/13/2013   PLT 185 07/13/2013    Assessment / Plan: Induction of labor due to post dates,  progressing well pitocin off.  Labor: Progressing normally Preeclampsia:  NA Fetal Wellbeing:  Category II Pain Control:  Epidural I/D:  n/a Anticipated MOD:  NSVD  Stephanne Greeley 07/14/2013, 12:07 PM

## 2013-07-14 NOTE — Progress Notes (Signed)
Audreyana Veazey is a 22 y.o. G1P0 at [redacted]w[redacted]d by LMP admitted for induction of labor due to Post dates. Due date 07-06-13.  Subjective:   Objective: BP 124/66  Pulse 79  Temp(Src) 98.7 F (37.1 C) (Oral)  Resp 16  Ht 5\' 6"  (1.676 m)  Wt 201 lb (91.173 kg)  BMI 32.46 kg/m2  SpO2 100%  LMP 09/29/2012      FHT:  FHR: 150-160 bpm, variability: moderate,  accelerations:  Present,  decelerations:  Present variables with pushing UC:   regular, every 2 minutes SVE:   Dilation: 10 Effacement (%): 100 Station: +2 Exam by:: wren,cnm  Labs: Lab Results  Component Value Date   WBC 10.3 07/13/2013   HGB 12.9 07/13/2013   HCT 39.0 07/13/2013   MCV 83.2 07/13/2013   PLT 185 07/13/2013    Assessment / Plan: Induction of labor due to postterm,  progressing well on pitocin  Labor: Progressing normally Preeclampsia:  n/a Fetal Wellbeing:  Category II Pain Control:  Epidural I/D:  n/a Anticipated MOD:  NSVD  Tenley Winward A 07/14/2013, 5:37 PM

## 2013-07-14 NOTE — Anesthesia Preprocedure Evaluation (Signed)
Anesthesia Evaluation  Patient identified by MRN, date of birth, ID band Patient awake    Reviewed: Allergy & Precautions, H&P , Patient's Chart, lab work & pertinent test results  Airway Mallampati: II TM Distance: >3 FB Neck ROM: full    Dental no notable dental hx.    Pulmonary neg pulmonary ROS,  breath sounds clear to auscultation  Pulmonary exam normal       Cardiovascular negative cardio ROS  Rhythm:regular Rate:Normal     Neuro/Psych negative neurological ROS  negative psych ROS   GI/Hepatic negative GI ROS, Neg liver ROS,   Endo/Other  negative endocrine ROS  Renal/GU negative Renal ROS     Musculoskeletal   Abdominal   Peds  Hematology negative hematology ROS (+) anemia ,   Anesthesia Other Findings   Reproductive/Obstetrics (+) Pregnancy                           Anesthesia Physical Anesthesia Plan  ASA: II  Anesthesia Plan: Epidural   Post-op Pain Management:    Induction:   Airway Management Planned:   Additional Equipment:   Intra-op Plan:   Post-operative Plan:   Informed Consent: I have reviewed the patients History and Physical, chart, labs and discussed the procedure including the risks, benefits and alternatives for the proposed anesthesia with the patient or authorized representative who has indicated his/her understanding and acceptance.     Plan Discussed with:   Anesthesia Plan Comments:         Anesthesia Quick Evaluation  

## 2013-07-15 LAB — CBC
MCV: 83.8 fL (ref 78.0–100.0)
Platelets: 172 10*3/uL (ref 150–400)
RBC: 4.33 MIL/uL (ref 3.87–5.11)
RDW: 14.9 % (ref 11.5–15.5)
WBC: 13.1 10*3/uL — ABNORMAL HIGH (ref 4.0–10.5)

## 2013-07-15 NOTE — Progress Notes (Signed)
Post Partum Day 1 Subjective: no complaints  Objective: Blood pressure 106/68, pulse 59, temperature 98.5 F (36.9 C), temperature source Oral, resp. rate 16, height 5\' 6"  (1.676 m), weight 201 lb (91.173 kg), last menstrual period 09/29/2012, SpO2 100.00%, unknown if currently breastfeeding.  Physical Exam:  General: alert and no distress Lochia: appropriate Uterine Fundus: firm Incision: healing well DVT Evaluation: No evidence of DVT seen on physical exam.   Recent Labs  07/13/13 0720 07/15/13 0605  HGB 12.9 11.8*  HCT 39.0 36.3    Assessment/Plan: Plan for discharge tomorrow   LOS: 2 days   HARPER,CHARLES A 07/15/2013, 8:49 AM

## 2013-07-15 NOTE — Progress Notes (Signed)
UR chart review completed.  

## 2013-07-15 NOTE — Anesthesia Postprocedure Evaluation (Signed)
Anesthesia Post Note  Patient: Penny Clark  Procedure(s) Performed: * No procedures listed *  Anesthesia type: Epidural  Patient location: Mother/Baby  Post pain: Pain level controlled  Post assessment: Post-op Vital signs reviewed  Last Vitals:  Filed Vitals:   07/15/13 0653  BP: 106/68  Pulse: 59  Temp: 36.9 C  Resp: 16    Post vital signs: Reviewed  Level of consciousness:alert  Complications: No apparent anesthesia complications

## 2013-07-16 DIAGNOSIS — Z789 Other specified health status: Secondary | ICD-10-CM

## 2013-07-16 HISTORY — DX: Other specified health status: Z78.9

## 2013-07-16 MED ORDER — TETANUS-DIPHTH-ACELL PERTUSSIS 5-2.5-18.5 LF-MCG/0.5 IM SUSP: 0.5000 mL | Freq: Once | INTRAMUSCULAR | Status: DC

## 2013-07-16 MED ORDER — LANOLIN HYDROUS EX OINT: 1.0000 "application " | TOPICAL_OINTMENT | CUTANEOUS | Status: DC | PRN

## 2013-07-16 MED ORDER — IBUPROFEN 600 MG PO TABS: 600.0000 mg | ORAL_TABLET | Freq: Four times a day (QID) | ORAL | Status: DC

## 2013-07-16 NOTE — Discharge Summary (Signed)
Obstetric Discharge Summary Reason for Admission: induction of labor Prenatal Procedures: none Intrapartum Procedures: spontaneous vaginal delivery Postpartum Procedures: none Complications-Operative and Postpartum: none Hemoglobin  Date Value Range Status  07/15/2013 11.8* 12.0 - 15.0 g/dL Final  1/61/0960 45.4   Final     HCT  Date Value Range Status  07/15/2013 36.3  36.0 - 46.0 % Final  01/01/2013 38   Final    Physical Exam:  General: alert and cooperative Lochia: appropriate Uterine Fundus: firm Incision: NA DVT Evaluation: No evidence of DVT seen on physical exam.  Discharge Diagnoses: Term Pregnancy-delivered  Discharge Information: Date: 07/16/2013 Activity: unrestricted Diet: routine Medications: Ibuprofen Condition: stable Instructions: refer to practice specific booklet Discharge to: home Follow-up Information   Follow up In 6 weeks.      Newborn Data: Live born female  Birth Weight: 8 lb 8.2 oz (3861 g) APGAR: 6, 10  Home with mother.  Penny Clark 07/16/2013, 10:03 AM

## 2013-08-03 ENCOUNTER — Ambulatory Visit (INDEPENDENT_AMBULATORY_CARE_PROVIDER_SITE_OTHER): Payer: Medicaid Other | Admitting: Obstetrics

## 2013-08-03 ENCOUNTER — Encounter: Payer: Self-pay | Admitting: Obstetrics

## 2013-08-03 DIAGNOSIS — Z3009 Encounter for other general counseling and advice on contraception: Secondary | ICD-10-CM

## 2013-08-03 NOTE — Progress Notes (Signed)
Subjective:     Penny Clark is a 22 y.o. female who presents for a postpartum visit. She is 3 weeks postpartum following a spontaneous vaginal delivery. I have fully reviewed the prenatal and intrapartum course. The delivery was at 41 gestational weeks. Outcome: spontaneous vaginal delivery. Anesthesia: epidural. Postpartum course has been WNL. Baby's course has been WNL. Baby is feeding by both breast and bottle - Gerber-Gentle. Bleeding thin lochia. Bowel function is normal. Bladder function is normal. Patient is not sexually active. Contraception method is abstinence. Postpartum depression screening: negative.  The following portions of the patient's history were reviewed and updated as appropriate: allergies, current medications, past family history, past medical history, past surgical history and problem list.  Review of Systems Pertinent items are noted in HPI.   Objective:    BP 124/80  Pulse 84  Temp(Src) 98.6 F (37 C)  Ht 5\' 6"  (1.676 m)  Wt 187 lb (84.823 kg)  BMI 30.2 kg/m2  Breastfeeding? Yes  General:  alert and no distress PE:  Deferred   Assessment:    Doing well.  Plan:    1. Contraception: abstinence 2. Options discussed. 3. Follow up in: 4 weeks or as needed.

## 2013-08-31 ENCOUNTER — Ambulatory Visit (INDEPENDENT_AMBULATORY_CARE_PROVIDER_SITE_OTHER): Payer: Medicaid Other | Admitting: Obstetrics

## 2013-08-31 ENCOUNTER — Encounter: Payer: Self-pay | Admitting: Obstetrics

## 2013-08-31 DIAGNOSIS — Z3009 Encounter for other general counseling and advice on contraception: Secondary | ICD-10-CM

## 2013-08-31 MED ORDER — NORETHINDRONE 0.35 MG PO TABS: 1.0000 | ORAL_TABLET | Freq: Every day | ORAL | Status: DC

## 2013-08-31 NOTE — Progress Notes (Signed)
Subjective:     Penny Clark is a 22 y.o. female who presents for a postpartum visit. She is 7 weeks postpartum following a spontaneous vaginal delivery. I have fully reviewed the prenatal and intrapartum course. The delivery was at 41 gestational weeks. Outcome: spontaneous vaginal delivery. Anesthesia: epidural. Postpartum course has been WNL. Baby's course has been WNL. Baby is feeding by both breast and bottle - [place] Good Start. Bleeding no bleeding. Bowel function is normal. Bladder function is normal. Patient is not sexually active. Contraception method is abstinence. Postpartum depression screening: negative.  The following portions of the patient's history were reviewed and updated as appropriate: allergies, current medications, past family history, past medical history, past social history, past surgical history and problem list.  Review of Systems Pertinent items are noted in HPI.   Objective:    BP 123/76  Pulse 72  Temp(Src) 97.6 F (36.4 C)  Ht 5\' 7"  (1.702 m)  Wt 186 lb (84.369 kg)  BMI 29.12 kg/m2  LMP 08/24/2013  Breastfeeding? No  General:  alert and no distress  Abdomen:  Soft, NT Uterus:  NSSC, NT                                    Assessment:     Normal postpartum exam. Pap smear not done at today's visit.   Plan:    1. Contraception: oral progesterone-only contraceptive 2. Micronor Rx. 3. Follow up in: 6 weeks or as needed.

## 2013-10-12 ENCOUNTER — Ambulatory Visit: Payer: Medicaid Other | Admitting: Obstetrics

## 2014-02-02 ENCOUNTER — Telehealth: Payer: Self-pay | Admitting: Family Medicine

## 2014-02-02 ENCOUNTER — Ambulatory Visit: Payer: Self-pay | Admitting: Physician Assistant

## 2014-02-02 VITALS — BP 102/80 | HR 108 | Temp 101.0°F | Resp 18 | Ht 64.0 in | Wt 176.0 lb

## 2014-02-02 DIAGNOSIS — B349 Viral infection, unspecified: Secondary | ICD-10-CM

## 2014-02-02 DIAGNOSIS — G4700 Insomnia, unspecified: Secondary | ICD-10-CM

## 2014-02-02 DIAGNOSIS — F419 Anxiety disorder, unspecified: Secondary | ICD-10-CM

## 2014-02-02 DIAGNOSIS — F341 Dysthymic disorder: Secondary | ICD-10-CM

## 2014-02-02 DIAGNOSIS — B9789 Other viral agents as the cause of diseases classified elsewhere: Secondary | ICD-10-CM

## 2014-02-02 DIAGNOSIS — F329 Major depressive disorder, single episode, unspecified: Secondary | ICD-10-CM

## 2014-02-02 MED ORDER — IPRATROPIUM BROMIDE 0.03 % NA SOLN: 2.0000 | Freq: Two times a day (BID) | NASAL | Status: DC

## 2014-02-02 MED ORDER — CLONAZEPAM 0.5 MG PO TABS: 0.2500 mg | ORAL_TABLET | Freq: Two times a day (BID) | ORAL | Status: DC | PRN

## 2014-02-02 MED ORDER — CITALOPRAM HYDROBROMIDE 20 MG PO TABS: 20.0000 mg | ORAL_TABLET | Freq: Every day | ORAL | Status: DC

## 2014-02-02 MED ORDER — BENZONATATE 100 MG PO CAPS: 100.0000 mg | ORAL_CAPSULE | Freq: Three times a day (TID) | ORAL | Status: DC | PRN

## 2014-02-02 MED ORDER — GUAIFENESIN ER 1200 MG PO TB12: 1.0000 | ORAL_TABLET | Freq: Two times a day (BID) | ORAL | Status: DC | PRN

## 2014-02-02 NOTE — Patient Instructions (Signed)
Get plenty of rest and drink at least 64 ounces of water daily. 

## 2014-02-02 NOTE — Progress Notes (Signed)
Subjective:    Patient ID: Penny Clark, female    DOB: Aug 25, 1991, 23 y.o.   MRN: [id number]   PCP: No PCP Per Patient  Chief Complaint  Patient presents with  . Nasal Congestion    All since Saturday  . Cough  . Generalized Body Aches  . Fever    Medications, allergies, past medical history, surgical history, family history, social history and problem list reviewed and updated.  HPI  "I've been sick since Saturday morning.  Really, really bad HA that hasn't gone away.  Every time I stand up or move or move my eyeballs it hurts really bad." Symptoms began 01/30/2014. No flu vaccine this season. Ear pain, facial pressure and pain,sore throat, body aches are severe. Nasal and sinus congestion (yellow, sometimes with blood). Cough, sometimes productive (doesn't know what color the sputum is). "Everything hurts." No nausea vomiting maybe a little diarrhea. No dizziness. No CP. No SOB. Subjective fever.  Also notes several years of insomnia, often not able to fall asleep until 5 am.  More problematic now that her infant daughter wakes up about 7am. Has tried good sleep hygiene practices without benefit.  "I just lie there, and my brain keeps thinking." No thoughts of harming herself or others. Previously tried Trazodone, which wasn't helpful, and citalopram and clonazepam, which worked well.  Unfortunately, she wasn't employed at the time and wasn't able to afford them. She hopes to restart them.  Review of Systems As above.    Objective:   Physical Exam  Vitals reviewed. Constitutional: She is oriented to person, place, and time. Vital signs are normal. She appears well-developed and well-nourished. No distress.  BP 102/80  Pulse 108  Temp(Src) 101 F (38.3 C) (Oral)  Resp 18  Ht 5\' 4"  (1.626 m)  Wt 176 lb (79.833 kg)  BMI 30.20 kg/m2  SpO2 97%  LMP 01/02/2014 Given ibuprofen 600 mg PO x 1 here.   HENT:  Head: Normocephalic and atraumatic.  Right Ear: Hearing,  tympanic membrane, external ear and ear canal normal.  Left Ear: Hearing, tympanic membrane, external ear and ear canal normal.  Nose: Mucosal edema and rhinorrhea present.  No foreign bodies. Right sinus exhibits no maxillary sinus tenderness and no frontal sinus tenderness. Left sinus exhibits no maxillary sinus tenderness and no frontal sinus tenderness.  Mouth/Throat: Uvula is midline, oropharynx is clear and moist and mucous membranes are normal. No uvula swelling. No oropharyngeal exudate.  Eyes: Conjunctivae and EOM are normal. Pupils are equal, round, and reactive to light. Right eye exhibits no discharge. Left eye exhibits no discharge. No scleral icterus.  Neck: Trachea normal, normal range of motion and full passive range of motion without pain. Neck supple. No mass and no thyromegaly present.  Cardiovascular: Normal rate, regular rhythm and normal heart sounds.   Pulmonary/Chest: Effort normal and breath sounds normal.  Lymphadenopathy:       Head (right side): No submandibular, no tonsillar, no preauricular, no posterior auricular and no occipital adenopathy present.       Head (left side): No submandibular, no tonsillar, no preauricular and no occipital adenopathy present.    She has no cervical adenopathy.       Right: No supraclavicular adenopathy present.       Left: No supraclavicular adenopathy present.  Neurological: She is alert and oriented to person, place, and time. She has normal strength. No cranial nerve deficit or sensory deficit.  Skin: Skin is warm, dry and intact. No rash  noted.  Psychiatric: She has a normal mood and affect. Her speech is normal and behavior is normal.          Assessment & Plan:  1. Viral syndrome Supportive care.  Anticipatory guidance.  RTC if symptoms worsen/persist. - ipratropium (ATROVENT) 0.03 % nasal spray; Place 2 sprays into both nostrils 2 (two) times daily.  Dispense: 30 mL; Refill: 0 - Guaifenesin (MUCINEX MAXIMUM STRENGTH) 1200  MG TB12; Take 1 tablet (1,200 mg total) by mouth every 12 (twelve) hours as needed.  Dispense: 14 tablet; Refill: 1 - benzonatate (TESSALON) 100 MG capsule; Take 1-2 capsules (100-200 mg total) by mouth 3 (three) times daily as needed for cough.  Dispense: 60 capsule; Refill: 0  2. Insomnia 3. Anxiety and depression Restart citalopram and clonazepam. - citalopram (CELEXA) 20 MG tablet; Take 1 tablet (20 mg total) by mouth daily.  Dispense: 30 tablet; Refill: 3 - clonazePAM (KLONOPIN) 0.5 MG tablet; Take 0.5-1 tablets (0.25-0.5 mg total) by mouth 2 (two) times daily as needed for anxiety.  Dispense: 30 tablet; Refill: 0  Return in about 3 months (around 05/04/2014), or if symptoms worsen or fail to improve, for re-evaluation.  Fernande Bras, PA-C Physician Assistant-Certified [facility]

## 2014-02-02 NOTE — Telephone Encounter (Signed)
Left message to return call. Need to find out what she was needing? She called earlier and her name and number were on a piece of paper to call back.

## 2014-02-03 NOTE — Telephone Encounter (Signed)
Pt has not been prescribed Trazadone since 2013. Just seen on 02/02/14 for anxiety and depression. Please advise.

## 2014-02-03 NOTE — Telephone Encounter (Signed)
Patient calling to request that her medication for trazodone is refilled to help her with sleep. She had recieved this medication before by Dr. Patsy Lageropland. She saw Chelle and got some medicines that are working like the celexa and klonopin but those are what she says helping her a little for anxiety but not for sleeping. Please call back if able to make this change, I told her we could put a message in to both providers.   Best: [phone]

## 2014-02-03 NOTE — Telephone Encounter (Signed)
I saw this patient 02/02/2014 and addressed this issue.

## 2014-04-20 ENCOUNTER — Other Ambulatory Visit: Payer: Self-pay | Admitting: Physician Assistant

## 2014-04-21 NOTE — Telephone Encounter (Signed)
faxed

## 2014-04-27 ENCOUNTER — Telehealth: Payer: Self-pay | Admitting: Radiology

## 2014-04-27 NOTE — Telephone Encounter (Signed)
LMVM for pt to CB. 

## 2014-04-27 NOTE — Telephone Encounter (Signed)
Please clarify the medication this patient needs. When she was here last, she wasn't taking trazodone. I see that we last  prescribed it in 2013.

## 2014-04-27 NOTE — Telephone Encounter (Signed)
[facility] called stating this pt is wanting a refill on her trazadone. She hasn't been here since 02/02/14. Please advise.

## 2014-04-29 MED ORDER — TRAZODONE HCL 50 MG PO TABS: 50.0000 mg | ORAL_TABLET | Freq: Every day | ORAL | Status: DC

## 2014-04-29 NOTE — Telephone Encounter (Signed)
Spoke to pt, she was of there impression her trazodone 50 mg would be refilled on 02/02/2014 along with the other medications being filled.  Please advise.

## 2014-04-29 NOTE — Telephone Encounter (Signed)
Pt wants trazadone refilled.

## 2014-04-29 NOTE — Telephone Encounter (Signed)
I guess I misunderstood.  This is what I wrote in the note from that visit: "Previously tried Trazodone, which wasn't helpful, and citalopram and clonazepam, which worked well."  I'm happy to refill the trazodone, if that's what she wants. Trazodone 50 mg, 1-2 PO QHS, #60, RF x 3

## 2014-05-05 ENCOUNTER — Other Ambulatory Visit: Payer: Self-pay | Admitting: Physician Assistant

## 2014-07-09 ENCOUNTER — Ambulatory Visit (INDEPENDENT_AMBULATORY_CARE_PROVIDER_SITE_OTHER): Payer: Self-pay

## 2014-07-09 ENCOUNTER — Ambulatory Visit (INDEPENDENT_AMBULATORY_CARE_PROVIDER_SITE_OTHER): Payer: Self-pay | Admitting: Internal Medicine

## 2014-07-09 VITALS — BP 110/70 | HR 77 | Temp 97.5°F | Resp 18 | Ht 66.0 in | Wt 173.0 lb

## 2014-07-09 DIAGNOSIS — M79609 Pain in unspecified limb: Secondary | ICD-10-CM

## 2014-07-09 DIAGNOSIS — M79674 Pain in right toe(s): Secondary | ICD-10-CM

## 2014-07-09 DIAGNOSIS — J069 Acute upper respiratory infection, unspecified: Secondary | ICD-10-CM

## 2014-07-09 DIAGNOSIS — F329 Major depressive disorder, single episode, unspecified: Secondary | ICD-10-CM

## 2014-07-09 DIAGNOSIS — S92919A Unspecified fracture of unspecified toe(s), initial encounter for closed fracture: Secondary | ICD-10-CM

## 2014-07-09 DIAGNOSIS — F341 Dysthymic disorder: Secondary | ICD-10-CM

## 2014-07-09 DIAGNOSIS — S92911A Unspecified fracture of right toe(s), initial encounter for closed fracture: Secondary | ICD-10-CM

## 2014-07-09 DIAGNOSIS — F32A Depression, unspecified: Secondary | ICD-10-CM

## 2014-07-09 DIAGNOSIS — B9789 Other viral agents as the cause of diseases classified elsewhere: Secondary | ICD-10-CM

## 2014-07-09 DIAGNOSIS — F419 Anxiety disorder, unspecified: Secondary | ICD-10-CM

## 2014-07-09 MED ORDER — IPRATROPIUM BROMIDE 0.03 % NA SOLN: 2.0000 | Freq: Two times a day (BID) | NASAL | Status: DC

## 2014-07-09 MED ORDER — CITALOPRAM HYDROBROMIDE 20 MG PO TABS: 20.0000 mg | ORAL_TABLET | Freq: Every day | ORAL | Status: DC

## 2014-07-09 MED ORDER — CLONAZEPAM 0.5 MG PO TABS: 0.2500 mg | ORAL_TABLET | Freq: Two times a day (BID) | ORAL | Status: DC | PRN

## 2014-07-09 MED ORDER — HYDROCOD POLST-CHLORPHEN POLST 10-8 MG/5ML PO LQCR: 5.0000 mL | Freq: Two times a day (BID) | ORAL | Status: DC | PRN

## 2014-07-09 MED ORDER — GUAIFENESIN ER 1200 MG PO TB12: 1.0000 | ORAL_TABLET | Freq: Two times a day (BID) | ORAL | Status: DC | PRN

## 2014-07-09 NOTE — Progress Notes (Signed)
Subjective:    Patient ID: Penny Clark, female    DOB: 02-24-1991, 23 y.o.   MRN: [id number]   PCP: No PCP Per Patient  Chief Complaint  Patient presents with  . Nasal Congestion  . Cough    x3 days   . Sore Throat  . Chills  . Toe Injury    rt 2nd   . rx refill    klonopin    Medications, allergies, past medical history, surgical history, family history, social history and problem list reviewed and updated.  Patient Active Problem List   Diagnosis Date Noted  . Anxiety state, unspecified 07/09/2014    Prior to Admission medications   Medication Sig Start Date End Date Taking? Authorizing Provider  norethindrone (MICRONOR,CAMILA,ERRIN) 0.35 MG tablet Take 1 tablet (0.35 mg total) by mouth daily. 08/31/13  Yes Brock Bad, MD  traZODone (DESYREL) 50 MG tablet Take 1-2 tablets (50-100 mg total) by mouth at bedtime. 04/29/14  Yes Han Vejar S Mack Thurmon, PA-C  citalopram (CELEXA) 20 MG tablet Take 1 tablet (20 mg total) by mouth daily. 02/02/14   Angellynn Kimberlin S Shadonna Benedick, PA-C  clonazePAM (KLONOPIN) 0.5 MG tablet Take 0.5-1 tablets (0.25-0.5 mg total) by mouth 2 (two) times daily as needed for anxiety. Need office visit for additional refills. 04/20/14   Fernande Bras, PA-C    HPI  This 23 y.o. female presents for evaluation of upper respiratory illness.  Felt like a cold was coming on.  General malaise, went to bed early.  Coughing keeping her awake. Subjective fever/chills.  Increased cough with exertion.  Coughing up yellow colored phlegm.  Runny nose. Chest pains with cough. Right ear pain with swallowing, coughing.  On her way here, she accidentally stubbed her RIGHT second toe on a door frame.  "I can't more it."  Needs a refill of clonazepam for anxiety.  Stopped citalopram because she thought it was for depression, and she's anxious not depressed.  However, she's also increased her clonazepam use and has run out.   Review of Systems As above.    Objective:   Physical  Exam  Vitals reviewed. Constitutional: She is oriented to person, place, and time. Vital signs are normal. She appears well-developed and well-nourished. No distress.  BP 110/70  Pulse 77  Temp(Src) 97.5 F (36.4 C) (Oral)  Resp 18  Ht  (1.676 m)  Wt 173 lb (78.472 kg)  BMI 27.94 kg/m2  SpO2 100%  LMP 06/19/2014  Breastfeeding? No   HENT:  Head: Normocephalic and atraumatic.  Right Ear: Hearing, tympanic membrane, external ear and ear canal normal.  Left Ear: Hearing, tympanic membrane, external ear and ear canal normal.  Nose: Mucosal edema and rhinorrhea present.  No foreign bodies. Right sinus exhibits no maxillary sinus tenderness and no frontal sinus tenderness. Left sinus exhibits no maxillary sinus tenderness and no frontal sinus tenderness.  Mouth/Throat: Uvula is midline, oropharynx is clear and moist and mucous membranes are normal. No uvula swelling. No oropharyngeal exudate.  Eyes: Conjunctivae and EOM are normal. Pupils are equal, round, and reactive to light. Right eye exhibits no discharge. Left eye exhibits no discharge. No scleral icterus.  Neck: Trachea normal, normal range of motion and full passive range of motion without pain. Neck supple. No mass and no thyromegaly present.  Cardiovascular: Normal rate, regular rhythm and normal heart sounds.   Pulmonary/Chest: Effort normal and breath sounds normal.  Musculoskeletal:  RIGHT 2nd toe is mildly swollen.  Tenderness of the middle  phalanx. Pain with ROM.  Lymphadenopathy:       Head (right side): No submandibular, no tonsillar, no preauricular, no posterior auricular and no occipital adenopathy present.       Head (left side): No submandibular, no tonsillar, no preauricular and no occipital adenopathy present.    She has no cervical adenopathy.       Right: No supraclavicular adenopathy present.       Left: No supraclavicular adenopathy present.  Neurological: She is alert and oriented to person, place, and  time. She has normal strength. No cranial nerve deficit or sensory deficit.  Skin: Skin is warm, dry and intact. No rash noted.  Psychiatric: She has a normal mood and affect. Her speech is normal and behavior is normal.      TOE: [facility] reading (PRIMARY) by  Dr. Merla Riches. Fracture of the middle phalanx of the second toe.  Non-displaced.       Assessment & Plan:  1. Pain of toe of right foot - DG Toe 2nd Right; Future  2. Toe fracture, right, closed, initial encounter Anticipatory guidance. Post-op shoe. If no significant improvement in the next 7 days, RTC for re-evaluation.  3. Viral URI with cough Anticipatory guidance provided. Supportive care.  - ipratropium (ATROVENT) 0.03 % nasal spray; Place 2 sprays into both nostrils 2 (two) times daily.  Dispense: 30 mL; Refill: 0 - Guaifenesin (MUCINEX MAXIMUM STRENGTH) 1200 MG TB12; Take 1 tablet (1,200 mg total) by mouth every 12 (twelve) hours as needed.  Dispense: 14 tablet; Refill: 1 - chlorpheniramine-HYDROcodone (TUSSIONEX PENNKINETIC ER) 10-8 MG/5ML LQCR; Take 5 mLs by mouth every 12 (twelve) hours as needed for cough (cough).  Dispense: 100 mL; Refill: 0  4. Anxiety and depression Restart citalopram (1/2 tablet daily x 1 week, then 1 tablet daily), which also helps anxiety. Refill clonazepam, but with hopes of reduced use using citalopram. Touch base with me in 4-6 weeks, by phone/My Chart is appropriate, as she has no insurance coverage. - citalopram (CELEXA) 20 MG tablet; Take 1 tablet (20 mg total) by mouth daily.  Dispense: 30 tablet; Refill: 3 - clonazePAM (KLONOPIN) 0.5 MG tablet; Take 0.5-1 tablets (0.25-0.5 mg total) by mouth 2 (two) times daily as needed for anxiety. Need office visit for additional refills.  Dispense: 30 tablet; Refill: 0   Fernande Bras, PA-C Physician Assistant-Certified [facility]  Fully participated in this evaluation including plan for  treatment Robert P. Sandria Bales.D.

## 2014-07-09 NOTE — Patient Instructions (Addendum)
When you start back on the Celexa, start with 1/2 tablet daily for one week, then increase to the whole tablet. Our goal is to reduce the need for the clonazepam (Klonopin). Let me know how you're doing in 4-6 weeks.  Get plenty of rest and drink at least 64 ounces of water daily.

## 2014-07-29 ENCOUNTER — Other Ambulatory Visit: Payer: Self-pay | Admitting: Physician Assistant

## 2014-08-02 NOTE — Telephone Encounter (Signed)
Faxed

## 2014-08-28 ENCOUNTER — Other Ambulatory Visit: Payer: Self-pay | Admitting: Obstetrics

## 2014-09-02 ENCOUNTER — Other Ambulatory Visit: Payer: Self-pay | Admitting: Physician Assistant

## 2014-09-02 ENCOUNTER — Encounter: Payer: Self-pay | Admitting: Internal Medicine

## 2014-09-02 DIAGNOSIS — F419 Anxiety disorder, unspecified: Principal | ICD-10-CM

## 2014-09-02 DIAGNOSIS — F329 Major depressive disorder, single episode, unspecified: Secondary | ICD-10-CM

## 2014-09-02 MED ORDER — CITALOPRAM HYDROBROMIDE 20 MG PO TABS: 30.0000 mg | ORAL_TABLET | Freq: Every day | ORAL | Status: DC

## 2014-09-02 MED ORDER — CLONAZEPAM 0.5 MG PO TABS: ORAL_TABLET | ORAL | Status: DC

## 2014-09-02 NOTE — Telephone Encounter (Signed)
Please PHONE IN the clonazepam as below:  Meds ordered this encounter  Medications  . citalopram (CELEXA) 20 MG tablet    Sig: Take 1.5 tablets (30 mg total) by mouth daily.    Dispense:  30 tablet    Refill:  3    Order Specific Question:  Supervising Provider    Answer:  DOOLITTLE, ROBERT P [[id number]]  . clonazePAM (KLONOPIN) 0.5 MG tablet    Sig: TAKE ONE-HALF TO ONE TABLET BY MOUTH TWICE DAILY AS NEEDED FOR ANXIETY    Dispense:  60 tablet    Refill:  0    Order Specific Question:  Supervising Provider    Answer:  Tonye PearsonOLITTLE, ROBERT P [[id number]]    Patient notified via My Chart.

## 2014-09-03 MED ORDER — CITALOPRAM HYDROBROMIDE 20 MG PO TABS: 30.0000 mg | ORAL_TABLET | Freq: Every day | ORAL | Status: DC

## 2014-09-03 NOTE — Telephone Encounter (Signed)
Called in clonazepam. Also called in citalopram because it printed instead of sending electronically. I noticed that quantity was written for only #30 and sig was to start taking 1.5 daily. I called in quantity of #45 and corrected in EPIC.

## 2014-09-10 ENCOUNTER — Encounter (HOSPITAL_COMMUNITY): Payer: Self-pay | Admitting: *Deleted

## 2014-09-10 ENCOUNTER — Emergency Department (HOSPITAL_COMMUNITY)
Admission: EM | Admit: 2014-09-10 | Discharge: 2014-09-10 | Disposition: A | Discharge status: Home / Self Care | Payer: Medicaid Other | Attending: Emergency Medicine | Admitting: Emergency Medicine

## 2014-09-10 ENCOUNTER — Emergency Department (HOSPITAL_COMMUNITY): Payer: Medicaid Other

## 2014-09-10 DIAGNOSIS — Z862 Personal history of diseases of the blood and blood-forming organs and certain disorders involving the immune mechanism: Secondary | ICD-10-CM | POA: Insufficient documentation

## 2014-09-10 DIAGNOSIS — Z793 Long term (current) use of hormonal contraceptives: Secondary | ICD-10-CM | POA: Diagnosis not present

## 2014-09-10 DIAGNOSIS — R0602 Shortness of breath: Secondary | ICD-10-CM

## 2014-09-10 DIAGNOSIS — J209 Acute bronchitis, unspecified: Secondary | ICD-10-CM | POA: Insufficient documentation

## 2014-09-10 DIAGNOSIS — Z720 Tobacco use: Secondary | ICD-10-CM | POA: Diagnosis not present

## 2014-09-10 DIAGNOSIS — Z79899 Other long term (current) drug therapy: Secondary | ICD-10-CM | POA: Insufficient documentation

## 2014-09-10 DIAGNOSIS — Z3202 Encounter for pregnancy test, result negative: Secondary | ICD-10-CM | POA: Insufficient documentation

## 2014-09-10 DIAGNOSIS — J208 Acute bronchitis due to other specified organisms: Secondary | ICD-10-CM

## 2014-09-10 LAB — CBG MONITORING, ED: Glucose-Capillary: 103 mg/dL — ABNORMAL HIGH (ref 70–99)

## 2014-09-10 LAB — POC URINE PREG, ED: PREG TEST UR: NEGATIVE

## 2014-09-10 MED ORDER — SODIUM CHLORIDE 0.9 % IV BOLUS (SEPSIS)
1000.0000 mL | Freq: Once | INTRAVENOUS | Status: AC
  Administered 2014-09-10: 1000 mL via INTRAVENOUS

## 2014-09-10 MED ORDER — BENZONATATE 100 MG PO CAPS: 100.0000 mg | ORAL_CAPSULE | Freq: Three times a day (TID) | ORAL | Status: DC | PRN

## 2014-09-10 MED ORDER — DEXAMETHASONE 4 MG PO TABS
10.0000 mg | ORAL_TABLET | Freq: Once | ORAL | Status: AC
  Administered 2014-09-10: 10 mg via ORAL
  Filled 2014-09-10: qty 3

## 2014-09-10 MED ORDER — ALBUTEROL SULFATE HFA 108 (90 BASE) MCG/ACT IN AERS
2.0000 | INHALATION_SPRAY | Freq: Once | RESPIRATORY_TRACT | Status: AC
  Administered 2014-09-10: 2 via RESPIRATORY_TRACT
  Filled 2014-09-10: qty 6.7

## 2014-09-10 NOTE — Discharge Instructions (Signed)

## 2014-09-10 NOTE — ED Notes (Signed)
Dr. Taylor at bedside.

## 2014-09-10 NOTE — ED Notes (Signed)
Coughing, shortness of breath with exertion, productive cough, positional dizziness, severe pain left rib.   Pain in ribs going on for over a month, coughing and no energy for a week, dizziness for on and off for 10 years.  Lately she has been passing out every single day

## 2014-09-10 NOTE — ED Notes (Signed)
  [facility] 103  

## 2014-09-10 NOTE — ED Notes (Signed)
Pt reports feeling weak and fatigued since Sunday, feels lightheaded when she stands up and sob, has productive cough with green sputum.

## 2014-09-10 NOTE — ED Provider Notes (Signed)
CSN: [id number]     Arrival date & time 09/10/14  1503 History   First MD Initiated Contact with Patient 09/10/14 1712     Chief Complaint  Patient presents with  . Weakness  . Shortness of Breath     (Consider location/radiation/quality/duration/timing/severity/associated sxs/prior Treatment) Patient is a 23 y.o. female presenting with shortness of breath. The history is provided by the patient. No language interpreter was used.  Shortness of Breath Severity:  Moderate Onset quality:  Gradual Duration:  5 days Timing:  Constant Progression:  Worsening Chronicity:  New Context: smoke exposure and URI   Relieved by:  None tried Worsened by:  Coughing Ineffective treatments:  None tried Associated symptoms: chest pain (with cough), cough, sputum production and syncope (single childhood)   Associated symptoms: no abdominal pain, no fever, no hemoptysis, no vomiting and no wheezing   Risk factors: no hx of PE/DVT     Past Medical History  Diagnosis Date  . Anemia   . Breastfeeding (infant) 07/16/2013  . Normal delivery 07/16/2013   Past Surgical History  Procedure Laterality Date  . No past surgeries     Family History  Problem Relation Age of Onset  . Cancer Maternal Grandfather    History  Substance Use Topics  . Smoking status: Current Every Day Smoker -- 0.30 packs/day    Types: Cigarettes    Last Attempt to Quit: 10/31/2012  . Smokeless tobacco: Never Used  . Alcohol Use: 0.0 - 2.5 oz/week    0-5 drink(s) per week     Comment: every couple of weeks   OB History    Gravida Para Term Preterm AB TAB SAB Ectopic Multiple Living   1 1 1       1      Review of Systems  Constitutional: Negative for fever.  Respiratory: Positive for cough, sputum production and shortness of breath. Negative for hemoptysis and wheezing.   Cardiovascular: Positive for chest pain (with cough) and syncope (single childhood).  Gastrointestinal: Negative for vomiting and abdominal pain.       Allergies  Review of patient's allergies indicates no known allergies.  Home Medications   Prior to Admission medications   Medication Sig Start Date End Date Taking? Authorizing Provider  chlorpheniramine-HYDROcodone (TUSSIONEX PENNKINETIC ER) 10-8 MG/5ML LQCR Take 5 mLs by mouth every 12 (twelve) hours as needed for cough (cough). 07/09/14   Chelle S Jeffery, PA-C  citalopram (CELEXA) 20 MG tablet Take 1.5 tablets (30 mg total) by mouth daily. 09/03/14   Chelle S Jeffery, PA-C  clonazePAM (KLONOPIN) 0.5 MG tablet TAKE ONE-HALF TO ONE TABLET BY MOUTH TWICE DAILY AS NEEDED FOR ANXIETY 09/02/14   Chelle S Jeffery, PA-C  Guaifenesin (MUCINEX MAXIMUM STRENGTH) 1200 MG TB12 Take 1 tablet (1,200 mg total) by mouth every 12 (twelve) hours as needed. 07/09/14   Chelle S Jeffery, PA-C  ipratropium (ATROVENT) 0.03 % nasal spray Place 2 sprays into both nostrils 2 (two) times daily. 07/09/14   Chelle Tessa LernerS Jeffery, PA-C  norethindrone (MICRONOR,CAMILA,ERRIN) 0.35 MG tablet TAKE ONE TABLET BY MOUTH ONCE DAILY 08/28/14   Brock Badharles A Harper, MD  traZODone (DESYREL) 50 MG tablet Take 1-2 tablets (50-100 mg total) by mouth at bedtime. 04/29/14   Chelle S Jeffery, PA-C   BP 116/68 mmHg  Pulse 74  Temp(Src) 97.5 F (36.4 C) (Oral)  Resp 13  SpO2 100%  LMP 08/18/2014 Physical Exam  Constitutional: She is oriented to person, place, and time. She appears well-developed and well-nourished.  No distress.  HENT:  Head: Normocephalic and atraumatic.  Mouth/Throat: Oropharynx is clear and moist.  Eyes: EOM are normal. Pupils are equal, round, and reactive to light.  Neck: Neck supple. No tracheal deviation present.  Cardiovascular: Normal rate, regular rhythm and normal heart sounds.   Pulmonary/Chest: Effort normal. No stridor. She has wheezes.  Abdominal: Soft. There is no tenderness.  Musculoskeletal: Normal range of motion.  Neurological: She is alert and oriented to person, place, and time. She has normal  strength and normal reflexes. No cranial nerve deficit or sensory deficit. She displays a negative Romberg sign. Coordination and gait normal. GCS eye subscore is 4. GCS verbal subscore is 5. GCS motor subscore is 6.  Skin: Skin is warm and dry.  Vitals reviewed.   ED Course  Procedures (including critical care time) Labs Review Labs Reviewed  CBG MONITORING, ED - Abnormal; Notable for the following:    Glucose-Capillary 103 (*)    All other components within normal limits  POC URINE PREG, ED    Imaging Review Dg Chest 2 View  09/10/2014   CLINICAL DATA:  Shortness of breath. Productive cough. Dizziness when standing. Chest pain below the left breast for 5 days. History of smoking. To 13 2009  EXAM: CHEST  2 VIEW  COMPARISON:  None.  FINDINGS: Heart size is normal. There is mild perihilar peribronchial thickening. No focal consolidations or pleural effusions are identified. Visualized osseous structures have a normal appearance.  IMPRESSION: 1. Bronchitic changes. 2.  No focal acute pulmonary abnormality.   Electronically Signed   By: Rosalie GumsBeth  Brown M.D.   On: 09/10/2014 15:46     EKG Interpretation   Date/Time:  Friday September 10 2014 15:17:12 EST Ventricular Rate:  92 PR Interval:  134 QRS Duration: 104 QT Interval:  384 QTC Calculation: 474 R Axis:   95 Text Interpretation:  Normal sinus rhythm Rightward axis Incomplete right  bundle branch block Borderline ECG No previous tracing Confirmed by BEATON   MD, ROBERT ([id number]) on 09/10/2014 6:13:42 PM      MDM   Final diagnoses:  SOB (shortness of breath)    23 yo female smoker with bronchitis. Cough productive of green sputum, generalized weakness, pain with cough. AFVSS. Wheezing on exam. Also notes episodes of syncope for the last 10 years that occur on standing. No dizziness on standing here. Nonfocal neuro exam. CBG 103. UPT negative. CXR without focal opacity, pneumothorax, but does note bronchitic changes. Will given IVF  bolus, albuterol, decadron. Stressed smoking cessation.   Improved with albuterol. Stable gait without lightheadedness. Appropriate for d/c with symptomatic management and f/u with PCP.     Abagail KitchensMegan Viveka Wilmeth, MD 09/10/14 2357  Nelia Shiobert L Beaton, MD 09/11/14 [phone]

## 2014-09-14 ENCOUNTER — Other Ambulatory Visit: Payer: Self-pay | Admitting: Physician Assistant

## 2014-09-16 ENCOUNTER — Telehealth: Payer: Self-pay | Admitting: Physician Assistant

## 2014-09-16 NOTE — Telephone Encounter (Signed)
No Answer.  Unable to lm

## 2014-09-16 NOTE — Telephone Encounter (Signed)
Please call this patient.  She sent a message in My Chart, to which I replied. She has not yet read my return message to her.  Please get an update on how she is doing with anxiety, and remind her to read her messages!

## 2014-09-20 NOTE — Telephone Encounter (Signed)
Please send this patient an unable to reach message.

## 2014-09-20 NOTE — Telephone Encounter (Signed)
Letter written and mailed.

## 2014-10-04 ENCOUNTER — Other Ambulatory Visit: Payer: Self-pay | Admitting: Physician Assistant

## 2014-10-04 ENCOUNTER — Other Ambulatory Visit: Payer: Self-pay | Admitting: Obstetrics

## 2014-10-04 NOTE — Telephone Encounter (Signed)
Faxed RF and notified pt.

## 2014-10-04 NOTE — Telephone Encounter (Signed)
I called pt bc I saw that Chelle had wanted an update last month and we couldn't reach pt. Pt reported that she has been taking both meds as Rxd but she hasn't really seen any improvement in her level of anxiety since taking citalopram. She has been using 1 whole tab of clonazepam BID but stated she "feels like she is bouncing off the walls all the time and anxious about everything". Pt reported that she still does not have reg ins, but does have Medicaid.

## 2014-11-06 ENCOUNTER — Other Ambulatory Visit: Payer: Self-pay | Admitting: Physician Assistant

## 2014-11-06 ENCOUNTER — Other Ambulatory Visit: Payer: Self-pay | Admitting: Obstetrics

## 2014-11-08 NOTE — Telephone Encounter (Signed)
Chelle, do you want to give pt RFs? See notes under 10/04/14 Refill enc.

## 2014-11-09 NOTE — Telephone Encounter (Signed)
Faxed

## 2014-12-15 ENCOUNTER — Other Ambulatory Visit: Payer: Self-pay | Admitting: Physician Assistant

## 2014-12-15 ENCOUNTER — Other Ambulatory Visit: Payer: Self-pay | Admitting: Obstetrics

## 2015-04-07 ENCOUNTER — Emergency Department (HOSPITAL_BASED_OUTPATIENT_CLINIC_OR_DEPARTMENT_OTHER)
Admission: EM | Admit: 2015-04-07 | Discharge: 2015-04-08 | Disposition: A | Discharge status: Home / Self Care | Payer: Medicaid Other | Attending: Emergency Medicine | Admitting: Emergency Medicine

## 2015-04-07 ENCOUNTER — Emergency Department (HOSPITAL_BASED_OUTPATIENT_CLINIC_OR_DEPARTMENT_OTHER): Payer: Medicaid Other

## 2015-04-07 ENCOUNTER — Encounter (HOSPITAL_BASED_OUTPATIENT_CLINIC_OR_DEPARTMENT_OTHER): Payer: Self-pay | Admitting: *Deleted

## 2015-04-07 DIAGNOSIS — Z720 Tobacco use: Secondary | ICD-10-CM | POA: Insufficient documentation

## 2015-04-07 DIAGNOSIS — Z862 Personal history of diseases of the blood and blood-forming organs and certain disorders involving the immune mechanism: Secondary | ICD-10-CM | POA: Diagnosis not present

## 2015-04-07 DIAGNOSIS — Y998 Other external cause status: Secondary | ICD-10-CM | POA: Insufficient documentation

## 2015-04-07 DIAGNOSIS — Y9389 Activity, other specified: Secondary | ICD-10-CM | POA: Insufficient documentation

## 2015-04-07 DIAGNOSIS — W51XXXA Accidental striking against or bumped into by another person, initial encounter: Secondary | ICD-10-CM | POA: Diagnosis not present

## 2015-04-07 DIAGNOSIS — S20212A Contusion of left front wall of thorax, initial encounter: Secondary | ICD-10-CM | POA: Diagnosis not present

## 2015-04-07 DIAGNOSIS — Y9289 Other specified places as the place of occurrence of the external cause: Secondary | ICD-10-CM | POA: Insufficient documentation

## 2015-04-07 DIAGNOSIS — S29001A Unspecified injury of muscle and tendon of front wall of thorax, initial encounter: Secondary | ICD-10-CM | POA: Diagnosis present

## 2015-04-07 MED ORDER — IBUPROFEN 800 MG PO TABS
800.0000 mg | ORAL_TABLET | Freq: Once | ORAL | Status: AC
  Administered 2015-04-07: 800 mg via ORAL
  Filled 2015-04-07: qty 1
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
## 2015-04-08 MED ORDER — TRAMADOL HCL 50 MG PO TABS: 50.0000 mg | ORAL_TABLET | Freq: Four times a day (QID) | ORAL | Status: DC | PRN

## 2015-04-08 MED ORDER — IBUPROFEN 800 MG PO TABS: 800.0000 mg | ORAL_TABLET | Freq: Three times a day (TID) | ORAL | Status: DC | PRN

## 2015-04-08 NOTE — Discharge Instructions (Signed)

## 2015-04-15 ENCOUNTER — Encounter (HOSPITAL_BASED_OUTPATIENT_CLINIC_OR_DEPARTMENT_OTHER): Payer: Self-pay | Admitting: *Deleted

## 2015-04-15 ENCOUNTER — Emergency Department (HOSPITAL_BASED_OUTPATIENT_CLINIC_OR_DEPARTMENT_OTHER)
Admission: EM | Admit: 2015-04-15 | Discharge: 2015-04-15 | Disposition: A | Discharge status: Home / Self Care | Payer: Medicaid Other | Attending: Emergency Medicine | Admitting: Emergency Medicine

## 2015-04-15 DIAGNOSIS — H9201 Otalgia, right ear: Secondary | ICD-10-CM | POA: Diagnosis present

## 2015-04-15 DIAGNOSIS — Z862 Personal history of diseases of the blood and blood-forming organs and certain disorders involving the immune mechanism: Secondary | ICD-10-CM | POA: Diagnosis not present

## 2015-04-15 DIAGNOSIS — Z720 Tobacco use: Secondary | ICD-10-CM | POA: Diagnosis not present

## 2015-04-15 DIAGNOSIS — H578 Other specified disorders of eye and adnexa: Secondary | ICD-10-CM | POA: Diagnosis not present

## 2015-04-15 MED ORDER — ANTIPYRINE-BENZOCAINE 5.4-1.4 % OT SOLN: 3.0000 [drp] | OTIC | Status: DC | PRN

## 2015-04-15 NOTE — ED Notes (Signed)
Pt c/o ? Insect to right ear x 8 hrs

## 2015-04-15 NOTE — ED Notes (Signed)
?   Insect in rt ear x 8 hours

## 2015-04-15 NOTE — ED Provider Notes (Signed)
CSN: [id number]     Arrival date & time 04/15/15  2002 History   First MD Initiated Contact with Patient 04/15/15 2046     Chief Complaint  Patient presents with  . Foreign Body in Ear     (Consider location/radiation/quality/duration/timing/severity/associated sxs/prior Treatment) HPI Penny Clark is a 24 y.o. female  With no medical problems, presents to emergency department with sensation of something in the right ear. Patient states she felt sharp pain today and felt like there was a bug that flew into the her right ear. Patient states this happened approximately 8 hours ago. She states she shook her head and felt better, however since then she continues to feeling better something in her right ear. She denies any pain. States it just feels like discomfort and itching. She has not tried any treatment at home. Denies any change in hearing.  Past Medical History  Diagnosis Date  . Anemia   . Breastfeeding (infant) 07/16/2013  . Normal delivery 07/16/2013   Past Surgical History  Procedure Laterality Date  . No past surgeries     Family History  Problem Relation Age of Onset  . Cancer Maternal Grandfather    History  Substance Use Topics  . Smoking status: Current Every Day Smoker -- 0.50 packs/day    Types: Cigarettes    Last Attempt to Quit: 10/31/2012  . Smokeless tobacco: Never Used  . Alcohol Use: 0.0 - 2.5 oz/week    0-5 Standard drinks or equivalent per week     Comment: every couple of weeks   OB History    Gravida Para Term Preterm AB TAB SAB Ectopic Multiple Living   Review of Systems  Constitutional: Negative for fever and chills.  HENT: Positive for ear pain. Negative for hearing loss.   Musculoskeletal: Negative for myalgias, arthralgias, neck pain and neck stiffness.  Skin: Negative for rash.  All other systems reviewed and are negative.     Allergies  Review of patient's allergies indicates no known allergies.  Home Medications    Prior to Admission medications   Medication Sig Start Date End Date Taking? Authorizing Provider  ibuprofen (ADVIL,MOTRIN) 800 MG tablet Take 1 tablet (800 mg total) by mouth every 8 (eight) hours as needed for mild pain. 04/08/15   Kristen N Ward, DO  traMADol (ULTRAM) 50 MG tablet Take 1 tablet (50 mg total) by mouth every 6 (six) hours as needed. 04/08/15   Kristen N Ward, DO   BP 165/96 mmHg  Pulse 78  Temp(Src) 98.6 F (37 C) (Oral)  Resp 16  Ht  (1.651 m)  Wt 138 lb (62.596 kg)  BMI 22.96 kg/m2  SpO2 100%  LMP 04/15/2015 Physical Exam  Constitutional: She is oriented to person, place, and time. She appears well-developed and well-nourished. No distress.  HENT:  Right Ear: External ear normal.  Left Ear: External ear normal.  Nose: Nose normal.  Mouth/Throat: Oropharynx is clear and moist.   Right ear canal and right TM looked normal with no foreign body.  Neurological: She is alert and oriented to person, place, and time.  Skin: Skin is warm and dry.    ED Course  Procedures (including critical care time) Labs Review Labs Reviewed - No data to display  Imaging Review No results found.   EKG Interpretation None      MDM   Final diagnoses:  Ear discomfort, right  patient with sensation of foreign object in her right ear. Her ear canal and TM are normal. No perforation. No obvious lesions or irritation.  Plan to discharge home, follow up only as needed. Auralgan drops for discomfort as needed. Question may be a small hair that is causing the symptom inside the ear. I have instructed her to irrigate it as well to see if that will help. Her ear was irrigated here by RN with no improvement.  Filed Vitals:   04/15/15 2009  BP: 165/96  Pulse: 78  Temp: 98.6 F (37 C)  TempSrc: Oral  Resp: 16  Height: 5\' 5"  (1.651 m)  Weight: 138 lb (62.596 kg)  SpO2: 100%       Jaynie Crumble, PA-C 04/15/15 2247  Pricilla Loveless, MD 04/17/15 1706

## 2015-04-15 NOTE — Discharge Instructions (Signed)
Auralgan drops as needed for discomfort. You can irrigate your ear at home. Follow up with your doctor.

## 2015-06-23 ENCOUNTER — Encounter (HOSPITAL_BASED_OUTPATIENT_CLINIC_OR_DEPARTMENT_OTHER): Payer: Self-pay | Admitting: Emergency Medicine

## 2015-06-23 ENCOUNTER — Emergency Department (HOSPITAL_BASED_OUTPATIENT_CLINIC_OR_DEPARTMENT_OTHER)
Admission: EM | Admit: 2015-06-23 | Discharge: 2015-06-23 | Disposition: A | Discharge status: Home / Self Care | Payer: Medicaid Other | Attending: Emergency Medicine | Admitting: Emergency Medicine

## 2015-06-23 DIAGNOSIS — Y9389 Activity, other specified: Secondary | ICD-10-CM | POA: Diagnosis not present

## 2015-06-23 DIAGNOSIS — X58XXXA Exposure to other specified factors, initial encounter: Secondary | ICD-10-CM | POA: Diagnosis not present

## 2015-06-23 DIAGNOSIS — N939 Abnormal uterine and vaginal bleeding, unspecified: Secondary | ICD-10-CM

## 2015-06-23 DIAGNOSIS — N926 Irregular menstruation, unspecified: Secondary | ICD-10-CM | POA: Insufficient documentation

## 2015-06-23 DIAGNOSIS — T192XXA Foreign body in vulva and vagina, initial encounter: Secondary | ICD-10-CM | POA: Insufficient documentation

## 2015-06-23 DIAGNOSIS — Y998 Other external cause status: Secondary | ICD-10-CM | POA: Insufficient documentation

## 2015-06-23 DIAGNOSIS — N760 Acute vaginitis: Secondary | ICD-10-CM | POA: Insufficient documentation

## 2015-06-23 DIAGNOSIS — B9689 Other specified bacterial agents as the cause of diseases classified elsewhere: Secondary | ICD-10-CM

## 2015-06-23 DIAGNOSIS — Y9289 Other specified places as the place of occurrence of the external cause: Secondary | ICD-10-CM | POA: Insufficient documentation

## 2015-06-23 DIAGNOSIS — Z862 Personal history of diseases of the blood and blood-forming organs and certain disorders involving the immune mechanism: Secondary | ICD-10-CM | POA: Diagnosis not present

## 2015-06-23 DIAGNOSIS — Z3202 Encounter for pregnancy test, result negative: Secondary | ICD-10-CM | POA: Diagnosis not present

## 2015-06-23 DIAGNOSIS — Z720 Tobacco use: Secondary | ICD-10-CM | POA: Insufficient documentation

## 2015-06-23 LAB — WET PREP, GENITAL
TRICH WET PREP: NONE SEEN
Yeast Wet Prep HPF POC: NONE SEEN

## 2015-06-23 LAB — CBC WITH DIFFERENTIAL/PLATELET
BASOS PCT: 0 % (ref 0–1)
Basophils Absolute: 0 10*3/uL (ref 0.0–0.1)
EOS ABS: 0.1 10*3/uL (ref 0.0–0.7)
EOS PCT: 1 % (ref 0–5)
HEMATOCRIT: 41.2 % (ref 36.0–46.0)
Hemoglobin: 13.5 g/dL (ref 12.0–15.0)
Lymphocytes Relative: 24 % (ref 12–46)
Lymphs Abs: 2.1 10*3/uL (ref 0.7–4.0)
MCH: 27.7 pg (ref 26.0–34.0)
MCHC: 32.8 g/dL (ref 30.0–36.0)
MCV: 84.6 fL (ref 78.0–100.0)
MONO ABS: 0.5 10*3/uL (ref 0.1–1.0)
MONOS PCT: 6 % (ref 3–12)
Neutro Abs: 5.9 10*3/uL (ref 1.7–7.7)
Neutrophils Relative %: 69 % (ref 43–77)
PLATELETS: 239 10*3/uL (ref 150–400)
RBC: 4.87 MIL/uL (ref 3.87–5.11)
RDW: 15.3 % (ref 11.5–15.5)
WBC: 8.6 10*3/uL (ref 4.0–10.5)

## 2015-06-23 LAB — URINALYSIS, ROUTINE W REFLEX MICROSCOPIC
BILIRUBIN URINE: NEGATIVE
Glucose, UA: NEGATIVE mg/dL
Ketones, ur: NEGATIVE mg/dL
Leukocytes, UA: NEGATIVE
Nitrite: NEGATIVE
Protein, ur: NEGATIVE mg/dL
Specific Gravity, Urine: 1.026 (ref 1.005–1.030)
Urobilinogen, UA: 0.2 mg/dL (ref 0.0–1.0)
pH: 6 (ref 5.0–8.0)

## 2015-06-23 LAB — URINE MICROSCOPIC-ADD ON

## 2015-06-23 LAB — PREGNANCY, URINE: Preg Test, Ur: NEGATIVE

## 2015-06-23 MED ORDER — METRONIDAZOLE 500 MG PO TABS: 500.0000 mg | ORAL_TABLET | Freq: Two times a day (BID) | ORAL | Status: AC

## 2015-06-23 NOTE — Discharge Instructions (Signed)
-   Take flagyl twice a day for seven days - Make a follow up appointment with [facility] for further evaluation of vaginal bleeding - Only use tampons or pads for vaginal bleeding, do not insert foreign objects into vagina - Return to ED with fevers, weakness, lightheadedness, passing out, abdominal pain, new vaginal discharge or further worsening of symptoms

## 2015-06-23 NOTE — ED Provider Notes (Signed)
CSN: [id number]     Arrival date & time 06/23/15  1447 History   First MD Initiated Contact with Patient 06/23/15 1501     Chief Complaint  Patient presents with  . Vaginal Bleeding    HPI  Penny Clark is a 24 year old female presenting with vaginal bleeding. She reports that she has had 2-3 months of intermittent bleeding. Bleeding alternates between light spotting to moderate flow. She is unsure of her LMP. She reports that she bleeds 1-2 times per week. Today she is experiencing moderate blood flow. No fevers, dizziness, syncope, shortness of breath, abdominal pain, nausea, vomiting, vaginal discharge or dysuria. She has not seen her OBGYN in two years.   Past Medical History  Diagnosis Date  . Anemia   . Breastfeeding (infant) 07/16/2013  . Normal delivery 07/16/2013   Past Surgical History  Procedure Laterality Date  . No past surgeries     Family History  Problem Relation Age of Onset  . Cancer Maternal Grandfather    Social History  Substance Use Topics  . Smoking status: Current Every Day Smoker -- 0.50 packs/day    Types: Cigarettes    Last Attempt to Quit: 10/31/2012  . Smokeless tobacco: Never Used  . Alcohol Use: 0.0 - 2.5 oz/week    0-5 Standard drinks or equivalent per week     Comment: every couple of weeks   OB History    Gravida Para Term Preterm AB TAB SAB Ectopic Multiple Living   Review of Systems  Constitutional: Negative for fever and chills.  Respiratory: Negative for shortness of breath.   Cardiovascular: Negative for chest pain.  Gastrointestinal: Negative for nausea, vomiting, abdominal pain, diarrhea and blood in stool.  Genitourinary: Positive for vaginal bleeding and menstrual problem. Negative for dysuria, hematuria, vaginal discharge, vaginal pain, pelvic pain and dyspareunia.  Musculoskeletal: Negative for myalgias.  Skin: Negative for rash.  Neurological: Negative for headaches.      Allergies  Review of patient's  allergies indicates no known allergies.  Home Medications   Prior to Admission medications   Medication Sig Start Date End Date Taking? Authorizing Provider  antipyrine-benzocaine Lyla Son) otic solution Place 3-4 drops into the right ear every 2 (two) hours as needed for ear pain. 04/15/15   Tatyana Kirichenko, PA-C  ibuprofen (ADVIL,MOTRIN) 800 MG tablet Take 1 tablet (800 mg total) by mouth every 8 (eight) hours as needed for mild pain. 04/08/15   Kristen N Ward, DO  metroNIDAZOLE (FLAGYL) 500 MG tablet Take 1 tablet (500 mg total) by mouth 2 (two) times daily. 06/23/15 06/30/15  Rolm Gala Artavis Cowie, PA-C  traMADol (ULTRAM) 50 MG tablet Take 1 tablet (50 mg total) by mouth every 6 (six) hours as needed. 04/08/15   Kristen N Ward, DO   BP 140/89 mmHg  Pulse 95  Temp(Src) 98.7 F (37.1 C) (Oral)  Resp 18  Ht  (1.651 m)  Wt 120 lb (54.432 kg)  BMI 19.97 kg/m2  SpO2 98%  LMP 06/23/2015 Physical Exam  Constitutional: She is oriented to person, place, and time. She appears well-developed and well-nourished. No distress.  Cardiovascular: Normal rate, regular rhythm and normal heart sounds.   Pulmonary/Chest: Effort normal and breath sounds normal.  Abdominal: Soft. Bowel sounds are normal. She exhibits no distension. There is no tenderness. There is no rebound and no guarding.  Genitourinary: Uterus normal. There is no rash or lesion on  the right labia. There is no rash or lesion on the left labia. Cervix exhibits no motion tenderness and no discharge. Right adnexum displays no mass and no tenderness. Left adnexum displays no mass and no tenderness. There is bleeding in the vagina. There is a foreign body in the vagina.  On speculum exam, a makeup sponge was visualized within the vagina. Pt reports that she didn't have a tampon yesterday so she used a small makeup sponge. She says she think she only used one. Makeup sponge was removed with forceps. With speculum and on digital exam, I swept the  entire vaginal vault and did not feel or visualize any more makeup sponges or foreign bodies.  Neurological: She is alert and oriented to person, place, and time.  Skin: Skin is warm and dry.  Psychiatric: She has a normal mood and affect.    ED Course  Procedures (including critical care time) Labs Review Labs Reviewed  WET PREP, GENITAL - Abnormal; Notable for the following:    Clue Cells Wet Prep HPF POC MANY (*)    WBC, Wet Prep HPF POC MANY (*)    All other components within normal limits  URINALYSIS, ROUTINE W REFLEX MICROSCOPIC (NOT AT [facility]) - Abnormal; Notable for the following:    Hgb urine dipstick SMALL (*)    All other components within normal limits  PREGNANCY, URINE  URINE MICROSCOPIC-ADD ON  CBC WITH DIFFERENTIAL/PLATELET  GC/CHLAMYDIA PROBE AMP ([place]) NOT AT [facility]    Patient discussed with Dr. Gwendolyn Grant  MDM   Final diagnoses:  Bacterial vaginosis  Vaginal bleeding    1. Bacterial Vaginosis Many clue cells and WBC on wet prep. Will treat for BV with Flagyl 500 mg BID for seven days 2. Vaginal bleeding Pt is not experiencing symptoms of acute blood loss. Hgb 13.5 today. Negative pregnancy test. Instructed patient to only use tampons and pads for bleeding and to not insert any other foreign objects into her vagina. Given contact information for [facility] and advised to make an appointment for further assessment of vaginal bleeding.  Return to ED with fevers, lightheadedness, syncope, SOB, abdominal pain or further worsening of symptoms.       Rolm Gala Yenesis Even, PA-C 06/23/15 1658  Elwin Mocha, MD 06/23/15 [phone]

## 2015-06-23 NOTE — ED Notes (Signed)
Patient states that she is having intermittent bleeding and is unsure when is her Menstrual and when is extra. The patient reports that she has not seen an ob in 2 years, the patient reports nothing is different today just felt like today was the day to get checked out because she has been procrastinating about taking care of it.

## 2015-06-24 LAB — GC/CHLAMYDIA PROBE AMP (~~LOC~~) NOT AT ARMC
CHLAMYDIA, DNA PROBE: NEGATIVE
NEISSERIA GONORRHEA: NEGATIVE

## 2015-06-30 ENCOUNTER — Other Ambulatory Visit: Payer: Self-pay | Admitting: Physician Assistant

## 2015-07-01 NOTE — Telephone Encounter (Signed)
Faxed

## 2015-11-25 ENCOUNTER — Emergency Department (HOSPITAL_BASED_OUTPATIENT_CLINIC_OR_DEPARTMENT_OTHER)
Admission: EM | Admit: 2015-11-25 | Discharge: 2015-11-25 | Disposition: A | Discharge status: Home / Self Care | Payer: Medicaid Other | Attending: Emergency Medicine | Admitting: Emergency Medicine

## 2015-11-25 ENCOUNTER — Encounter (HOSPITAL_BASED_OUTPATIENT_CLINIC_OR_DEPARTMENT_OTHER): Payer: Self-pay

## 2015-11-25 DIAGNOSIS — F17210 Nicotine dependence, cigarettes, uncomplicated: Secondary | ICD-10-CM | POA: Insufficient documentation

## 2015-11-25 DIAGNOSIS — R52 Pain, unspecified: Secondary | ICD-10-CM | POA: Diagnosis present

## 2015-11-25 DIAGNOSIS — Z862 Personal history of diseases of the blood and blood-forming organs and certain disorders involving the immune mechanism: Secondary | ICD-10-CM | POA: Diagnosis not present

## 2015-11-25 DIAGNOSIS — J020 Streptococcal pharyngitis: Secondary | ICD-10-CM | POA: Diagnosis not present

## 2015-11-25 LAB — RAPID STREP SCREEN (MED CTR MEBANE ONLY): Streptococcus, Group A Screen (Direct): POSITIVE — AB

## 2015-11-25 MED ORDER — IBUPROFEN 800 MG PO TABS: 800.0000 mg | ORAL_TABLET | Freq: Three times a day (TID) | ORAL | Status: DC

## 2015-11-25 MED ORDER — CHLORHEXIDINE GLUCONATE 0.12 % MT SOLN: 15.0000 mL | Freq: Two times a day (BID) | OROMUCOSAL | Status: DC

## 2015-11-25 MED ORDER — PENICILLIN G BENZATHINE 1200000 UNIT/2ML IM SUSP
1.2000 10*6.[IU] | Freq: Once | INTRAMUSCULAR | Status: AC
  Administered 2015-11-25: 1.2 10*6.[IU] via INTRAMUSCULAR
  Filled 2015-11-25: qty 2

## 2015-11-25 MED FILL — IBUPROFEN 800 MG TABLET: 800 | 7 days supply | Qty: 21 | Fill #0

## 2015-11-25 MED FILL — CHLORHEXIDINE 0.12% RINSE: 0.12 | 16 days supply | Qty: 473 | Fill #0

## 2015-11-25 NOTE — Discharge Instructions (Signed)

## 2015-11-25 NOTE — ED Notes (Signed)
Patient is alert and oriented x3.  She was given DC instructions and follow up visit instructions.  Patient gave verbal understanding. She was DC ambulatory under her own power to home.  V/S stable.  He was not showing any signs of distress on DC 

## 2015-11-25 NOTE — ED Provider Notes (Signed)
CSN: [id number]     Arrival date & time 11/25/15  1247 History   First MD Initiated Contact with Patient 11/25/15 1436     Chief Complaint  Patient presents with  . Generalized Body Aches     (Consider location/radiation/quality/duration/timing/severity/associated sxs/prior Treatment) HPI    25 year old female with history of anemia presenting for evaluation of flu-like symptoms. For the past 5 days pt has had head cold, chills, subjective fever, sinus pressure, headache, ear pain, sore throat, difficulty swallowing and productive cough with green sputum.  Report sinus congestion.  Has tried decongestant for the past 2 days without adequate relief.  Pain there is most noticeable to her right ear. She also report she is constantly around kids.   She did not have flu shot. Patient is a smoker.    Past Medical History  Diagnosis Date  . Anemia   . Breastfeeding (infant) 07/16/2013  . Normal delivery 07/16/2013   Past Surgical History  Procedure Laterality Date  . No past surgeries     Family History  Problem Relation Age of Onset  . Cancer Maternal Grandfather    Social History  Substance Use Topics  . Smoking status: Current Every Day Smoker -- 0.50 packs/day    Types: Cigarettes    Last Attempt to Quit: 10/31/2012  . Smokeless tobacco: Never Used  . Alcohol Use: Yes     Comment: social   OB History    Gravida Para Term Preterm AB TAB SAB Ectopic Multiple Living   Review of Systems  All other systems reviewed and are negative.     Allergies  Review of patient's allergies indicates no known allergies.  Home Medications   Prior to Admission medications   Not on File   BP 141/116 mmHg  Pulse 104  Temp(Src) 98.6 F (37 C) (Oral)  Resp 18  Ht  (1.651 m)  Wt 52.164 kg  BMI 19.14 kg/m2  SpO2 100%  LMP 11/11/2015 Physical Exam  Constitutional: She is oriented to person, place, and time. She appears well-developed and well-nourished. No  distress.  HENT:  Head: Atraumatic.   Ears: TMs normal bilaterally, no pain with ear lobes manipulation  nose: mildly boggy turbinates but no rhinorrhea  Throat: Uvula is midline, no tonsillar enlargement or exudates, no trismus. Mild posterior oropharyngeal erythema.  Eyes: Conjunctivae are normal.  Neck: Neck supple.   No nuchal rigidity  Cardiovascular: Normal rate and regular rhythm.   Pulmonary/Chest: Effort normal and breath sounds normal.  Abdominal: Soft. There is no tenderness.  Lymphadenopathy:    She has no cervical adenopathy.  Neurological: She is alert and oriented to person, place, and time.  Skin: No rash noted.  Psychiatric: She has a normal mood and affect.  Nursing note and vitals reviewed.   ED Course  Procedures (including critical care time) Labs Review Labs Reviewed  RAPID STREP SCREEN (NOT AT [facility]) - Abnormal; Notable for the following:    Streptococcus, Group A Screen (Direct) POSITIVE (*)    All other components within normal limits    Imaging Review No results found. I have personally reviewed and evaluated these images and lab results as part of my medical decision-making.   EKG Interpretation None      MDM   Final diagnoses:  Strep pharyngitis    BP 141/116 mmHg  Pulse 104  Temp(Src) 98.6 F (37 C) (Oral)  Resp 18  Ht  (1.651 m)  Wt 52.164 kg  BMI 19.14 kg/m2  SpO2 100%  LMP 11/11/2015   3:22 PM  patient presents with flulike symptoms however she rapid strep test is positive for strep infection. Bicillin 1.3 million unit given IM. Systematic treatment provided. ENT referral given as needed.  Fayrene Helper, PA-C 11/25/15 1642  Fayrene Helper, PA-C 11/25/15 1655  Fayrene Helper, PA-C 11/25/15 1655  Loren Racer, MD 11/27/15 [phone]

## 2015-11-25 NOTE — ED Notes (Addendum)
C/o body aches, ear pain, sore throat x 5 days-NAD-has fast food with her

## 2017-04-05 ENCOUNTER — Encounter (HOSPITAL_BASED_OUTPATIENT_CLINIC_OR_DEPARTMENT_OTHER): Payer: Self-pay | Admitting: *Deleted

## 2017-04-05 ENCOUNTER — Emergency Department (HOSPITAL_BASED_OUTPATIENT_CLINIC_OR_DEPARTMENT_OTHER)
Admission: EM | Admit: 2017-04-05 | Discharge: 2017-04-05 | Disposition: A | Discharge status: Home / Self Care | Payer: Medicaid Other | Attending: Emergency Medicine | Admitting: Emergency Medicine

## 2017-04-05 DIAGNOSIS — N760 Acute vaginitis: Secondary | ICD-10-CM | POA: Insufficient documentation

## 2017-04-05 DIAGNOSIS — F17210 Nicotine dependence, cigarettes, uncomplicated: Secondary | ICD-10-CM | POA: Insufficient documentation

## 2017-04-05 DIAGNOSIS — B9689 Other specified bacterial agents as the cause of diseases classified elsewhere: Secondary | ICD-10-CM

## 2017-04-05 LAB — URINALYSIS, ROUTINE W REFLEX MICROSCOPIC
BILIRUBIN URINE: NEGATIVE
Glucose, UA: NEGATIVE mg/dL
Hgb urine dipstick: NEGATIVE
KETONES UR: NEGATIVE mg/dL
Leukocytes, UA: NEGATIVE
NITRITE: NEGATIVE
PROTEIN: 30 mg/dL — AB
Specific Gravity, Urine: 1.025 (ref 1.005–1.030)
pH: 7 (ref 5.0–8.0)

## 2017-04-05 LAB — WET PREP, GENITAL
Sperm: NONE SEEN
TRICH WET PREP: NONE SEEN
Yeast Wet Prep HPF POC: NONE SEEN

## 2017-04-05 LAB — URINALYSIS, MICROSCOPIC (REFLEX)

## 2017-04-05 LAB — PREGNANCY, URINE: Preg Test, Ur: NEGATIVE

## 2017-04-05 MED ORDER — METRONIDAZOLE 500 MG PO TABS: 500.0000 mg | ORAL_TABLET | Freq: Two times a day (BID) | ORAL | 0 refills | Status: DC

## 2017-04-05 MED FILL — metroNIDAZOLE 500 MG TABS: 500 | 7 days supply | Qty: 14 | Fill #0

## 2017-04-05 NOTE — Discharge Instructions (Signed)
Read the information below.  Use the prescribed medication as directed.  Please discuss all new medications with your pharmacist.  You may return to the Emergency Department at any time for worsening condition or any new symptoms that concern you.    °

## 2017-04-05 NOTE — ED Triage Notes (Signed)
Pt requesting to be put back on birthcontrol and anxiety medications.

## 2017-04-05 NOTE — ED Triage Notes (Signed)
Bilateral lower abdominal pain intermittently.  States that she was exposed to STDs over the last couple of months.

## 2017-04-05 NOTE — ED Provider Notes (Signed)
[facility]   CSN: [id number] Arrival date & time: 04/05/17  1424     History   Chief Complaint Chief Complaint  Patient presents with  . Abdominal Pain    HPI Penny Clark is a 26 y.o. female.  HPI   Pt p/w intermittent lower abdominal pain that is sharp, bilateral, comes and goes quickly, has been occurring randomly over the past 2-3 months.  Last menstrual period was last week, was 10 days late and very heavy.  She does not think she was pregnant at the time.  Does think she may have been exposed to STDs over the past few months.  Denies fevers, chills, myalgias, N/V, urinary symptoms, abnormal vaginal discharge, change in bowel habits.  No hx abdominal surgeries. Has not been on birth control for 3 years.   Past Medical History:  Diagnosis Date  . Anemia   . Breastfeeding (infant) 07/16/2013  . Normal delivery 07/16/2013    Patient Active Problem List   Diagnosis Date Noted  . Anxiety state, unspecified 07/09/2014    Past Surgical History:  Procedure Laterality Date  . NO PAST SURGERIES      OB History    Gravida Para Term Preterm AB Living   1 1 1     1    SAB TAB Ectopic Multiple Live Births           1       Home Medications    Prior to Admission medications   Medication Sig Start Date End Date Taking? Authorizing Provider  metroNIDAZOLE (FLAGYL) 500 MG tablet Take 1 tablet (500 mg total) by mouth 2 (two) times daily. One po bid x 7 days 04/05/17   Trixie DredgeWest, Celia Friedland, PA-C    Family History Family History  Problem Relation Age of Onset  . Cancer Maternal Grandfather     Social History Social History  Substance Use Topics  . Smoking status: Current Every Day Smoker    Packs/day: 0.50    Types: Cigarettes    Last attempt to quit: 10/31/2012  . Smokeless tobacco: Never Used  . Alcohol use Yes     Comment: social     Allergies   Patient has no known allergies.   Review of Systems Review of Systems  All other systems  reviewed and are negative.    Physical Exam Updated Vital Signs BP (!) 156/107 (BP Location: Right Arm)   Pulse 98   Temp 98.5 F (36.9 C) (Oral)   Resp 18   Ht 5\' 5"  (1.651 m)   Wt 54.4 kg (120 lb)   LMP 03/29/2017   SpO2 98%   BMI 19.97 kg/m   Physical Exam  Constitutional: She appears well-developed and well-nourished. No distress.  HENT:  Head: Normocephalic and atraumatic.  Neck: Neck supple.  Cardiovascular: Normal rate and regular rhythm.   Pulmonary/Chest: Effort normal and breath sounds normal. No respiratory distress. She has no wheezes. She has no rales.  Abdominal: Soft. She exhibits no distension. There is no tenderness. There is no rebound and no guarding.  Genitourinary: Uterus is not tender. Cervix exhibits no motion tenderness. Right adnexum displays no mass, no tenderness and no fullness. Left adnexum displays no mass, no tenderness and no fullness. No erythema, tenderness or bleeding in the vagina. No foreign body in the vagina. Vaginal discharge found.  Genitourinary Comments: Moderate amount of thin yellow discharge in vagina.    Neurological: She is alert.  Skin: She is not diaphoretic.  Nursing note and vitals reviewed.    ED Treatments / Results  Labs (all labs ordered are listed, but only abnormal results are displayed) Labs Reviewed  WET PREP, GENITAL - Abnormal; Notable for the following:       Result Value   Clue Cells Wet Prep HPF POC PRESENT (*)    WBC, Wet Prep HPF POC MANY (*)    All other components within normal limits  URINALYSIS, ROUTINE W REFLEX MICROSCOPIC - Abnormal; Notable for the following:    APPearance CLOUDY (*)    Protein, ur 30 (*)    All other components within normal limits  URINALYSIS, MICROSCOPIC (REFLEX) - Abnormal; Notable for the following:    Bacteria, UA MANY (*)    Squamous Epithelial / LPF 6-30 (*)    All other components within normal limits  PREGNANCY, URINE  RPR  HIV ANTIBODY (ROUTINE TESTING)    GC/CHLAMYDIA PROBE AMP ([place]) NOT AT [facility]    EKG  EKG Interpretation None       Radiology No results found.  Procedures Procedures (including critical care time)  Medications Ordered in ED Medications - No data to display   Initial Impression / Assessment and Plan / ED Course  I have reviewed the triage vital signs and the nursing notes.  Pertinent labs & imaging results that were available during my care of the patient were reviewed by me and considered in my medical decision making (see chart for details).     Afebrile, nontoxic patient with intermittent lower abdominal pain over the past few months.  She does have abnormal vaginal discharge on exam.  No PID, doubt TOA.  Clue cells on wet prep.  Pt opted not to have empiric treatment for STDs.  She is not pregnant.  Abdominal exam is unremarkable.  Encouraged close follow up with health department or [facility] for birth control.  Resources given for PCP follow up as well.     D/C home with flagyl.  Discussed result, findings, treatment, and follow up  with patient.  Pt given return precautions.  Pt verbalizes understanding and agrees with plan.       Final Clinical Impressions(s) / ED Diagnoses   Final diagnoses:  BV (bacterial vaginosis)    New Prescriptions Discharge Medication List as of 04/05/2017  3:55 PM    START taking these medications   Details  metroNIDAZOLE (FLAGYL) 500 MG tablet Take 1 tablet (500 mg total) by mouth 2 (two) times daily. One po bid x 7 days, Starting Fri 04/05/2017, Print         Trixie Dredge, [place] 04/05/17 1705    Blane Ohara, MD 04/09/17 [phone]

## 2017-04-06 LAB — RPR: RPR Ser Ql: NONREACTIVE

## 2017-04-06 LAB — HIV ANTIBODY (ROUTINE TESTING W REFLEX): HIV Screen 4th Generation wRfx: NONREACTIVE

## 2017-04-08 LAB — GC/CHLAMYDIA PROBE AMP (~~LOC~~) NOT AT ARMC
Chlamydia: NEGATIVE
NEISSERIA GONORRHEA: NEGATIVE

## 2017-04-16 ENCOUNTER — Ambulatory Visit: Payer: Medicaid Other | Admitting: Physician Assistant

## 2017-04-17 ENCOUNTER — Ambulatory Visit (INDEPENDENT_AMBULATORY_CARE_PROVIDER_SITE_OTHER): Payer: Self-pay | Admitting: Physician Assistant

## 2017-04-17 ENCOUNTER — Encounter: Payer: Self-pay | Admitting: Physician Assistant

## 2017-04-17 VITALS — BP 150/98 | HR 95 | Temp 98.0°F | Resp 18 | Ht 65.0 in | Wt 118.8 lb

## 2017-04-17 DIAGNOSIS — F329 Major depressive disorder, single episode, unspecified: Secondary | ICD-10-CM

## 2017-04-17 DIAGNOSIS — Z309 Encounter for contraceptive management, unspecified: Secondary | ICD-10-CM

## 2017-04-17 DIAGNOSIS — F419 Anxiety disorder, unspecified: Secondary | ICD-10-CM

## 2017-04-17 DIAGNOSIS — R030 Elevated blood-pressure reading, without diagnosis of hypertension: Secondary | ICD-10-CM

## 2017-04-17 MED ORDER — CLONAZEPAM 1 MG PO TABS: 1.0000 mg | ORAL_TABLET | Freq: Two times a day (BID) | ORAL | 0 refills | Status: DC | PRN

## 2017-04-17 MED ORDER — NORGESTIMATE-ETH ESTRADIOL 0.25-35 MG-MCG PO TABS: 1.0000 | ORAL_TABLET | Freq: Every day | ORAL | 4 refills | Status: DC

## 2017-04-17 MED ORDER — CITALOPRAM HYDROBROMIDE 20 MG PO TABS: 20.0000 mg | ORAL_TABLET | Freq: Every day | ORAL | 3 refills | Status: DC

## 2017-04-17 NOTE — Patient Instructions (Signed)
     IF you received an x-ray today, you will receive an invoice from [facility]. Please contact  Radiology at [phone] with questions or concerns regarding your invoice.   IF you received labwork today, you will receive an invoice from [facility]. Please contact [facility] at [phone] with questions or concerns regarding your invoice.   Our billing staff will not be able to assist you with questions regarding bills from these companies.  You will be contacted with the lab results as soon as they are available. The fastest way to get your results is to activate your My Chart account. Instructions are located on the last page of this paperwork. If you have not heard from us regarding the results in 2 weeks, please contact this office.     
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
## 2017-04-17 NOTE — Assessment & Plan Note (Signed)
Resume treatment with citalopram and clonazepam. Close follow-up. Anticipatory guidance provided. Re-consider CBT, as she improves, this may support her in gaining visitation with her daughter.

## 2017-04-20 ENCOUNTER — Telehealth: Payer: Self-pay | Admitting: Physician Assistant

## 2017-04-20 NOTE — Telephone Encounter (Signed)
Penny Clark - Pt wants a call back.  Says her medication is not helping. [phone]

## 2017-04-20 NOTE — Telephone Encounter (Signed)
Penny Clark, the patient need to talk to you. Please see the message. Thanks.

## 2017-04-22 NOTE — Telephone Encounter (Signed)
Please call this patient and get details. I will not be in the office today.

## 2017-04-23 NOTE — Telephone Encounter (Signed)
Tried to call pt and unable to leave VM on cell

## 2017-04-24 ENCOUNTER — Ambulatory Visit (INDEPENDENT_AMBULATORY_CARE_PROVIDER_SITE_OTHER): Payer: Self-pay | Admitting: Physician Assistant

## 2017-04-24 VITALS — BP 141/93 | HR 89 | Temp 98.6°F | Resp 16 | Ht 65.0 in | Wt 117.0 lb

## 2017-04-24 DIAGNOSIS — F329 Major depressive disorder, single episode, unspecified: Secondary | ICD-10-CM

## 2017-04-24 DIAGNOSIS — F419 Anxiety disorder, unspecified: Secondary | ICD-10-CM

## 2017-04-24 DIAGNOSIS — G4700 Insomnia, unspecified: Secondary | ICD-10-CM

## 2017-04-24 DIAGNOSIS — R112 Nausea with vomiting, unspecified: Secondary | ICD-10-CM

## 2017-04-24 MED ORDER — AMITRIPTYLINE HCL 50 MG PO TABS: 25.0000 mg | ORAL_TABLET | Freq: Every day | ORAL | 0 refills | Status: DC

## 2017-04-24 MED ORDER — RANITIDINE HCL 150 MG PO TABS: 150.0000 mg | ORAL_TABLET | Freq: Two times a day (BID) | ORAL | 0 refills | Status: DC

## 2017-04-24 MED ORDER — CLONAZEPAM 1 MG PO TABS: 1.0000 mg | ORAL_TABLET | Freq: Two times a day (BID) | ORAL | 0 refills | Status: DC | PRN

## 2017-04-24 MED ORDER — CITALOPRAM HYDROBROMIDE 40 MG PO TABS: 40.0000 mg | ORAL_TABLET | Freq: Every day | ORAL | 3 refills | Status: DC

## 2017-04-24 NOTE — Patient Instructions (Addendum)
1. INCREASE the citalopram (celexa) to 40 mg. Take 2 of the 20 mg tablets together to use up what you have, THEN start the 40 mg tablets that I sent to the pharmacy. 2. CONTINUE the clonazepam, twice daily as needed. 3. ADD the amitriptyline (Elavil) at bedtime. 4. ADD the ranitidine, twice daily, morning and evening, to help reduce the nausea.      IF you received an x-ray today, you will receive an invoice from [facility]. Please contact [facility] at [phone] with questions or concerns regarding your invoice.   IF you received labwork today, you will receive an invoice from [facility]. Please contact [facility] at [phone] with questions or concerns regarding your invoice.   Our billing staff will not be able to assist you with questions regarding bills from these companies.  You will be contacted with the lab results as soon as they are available. The fastest way to get your results is to activate your My Chart account. Instructions are located on the last page of this paperwork. If you have not heard from us regarding the results in 2 weeks, please contact this office.
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
## 2017-04-25 LAB — TSH: TSH: 2.85 u[IU]/mL (ref 0.450–4.500)

## 2017-04-25 LAB — T4, FREE: FREE T4: 1 ng/dL (ref 0.82–1.77)

## 2017-04-25 NOTE — Assessment & Plan Note (Signed)
INCREASE citalopram from 20 to 40 mg. CONTINUE clonazepam BID as needed. ADD amitriptyline at HS (considered trazodone, which she has used previously, but elected against it due to potential QT prolongation when citalopram and trazodone are used together). Update thyroid studies to rule out hyperthyroidism as driver of her symptoms. Discussed ways to get her faith community needs met without aggravating the situation with her ex.

## 2017-04-25 NOTE — Telephone Encounter (Signed)
Patient seen in the office 04/24/2017

## 2017-05-07 ENCOUNTER — Other Ambulatory Visit: Payer: Self-pay | Admitting: Physician Assistant

## 2017-05-07 DIAGNOSIS — F329 Major depressive disorder, single episode, unspecified: Secondary | ICD-10-CM

## 2017-05-07 DIAGNOSIS — F419 Anxiety disorder, unspecified: Principal | ICD-10-CM

## 2017-05-07 NOTE — Telephone Encounter (Addendum)
Due to the late hour and holiday tomorrow, I called this to the patient's pharmacy, and have notified her by My Chart.  Meds ordered this encounter  Medications  . clonazePAM (KLONOPIN) 1 MG tablet    Sig: TAKE 1 TABLET BY MOUTH TWICE DAILY AS NEEDED FOR ANXIETY    Dispense:  20 tablet    Refill:  0

## 2017-05-07 NOTE — Telephone Encounter (Signed)
Meds ordered this encounter  Medications  . clonazePAM (KLONOPIN) 1 MG tablet    Sig: TAKE 1 TABLET BY MOUTH TWICE DAILY AS NEEDED FOR ANXIETY    Dispense:  20 tablet    Refill:  0

## 2017-05-23 ENCOUNTER — Other Ambulatory Visit: Payer: Self-pay | Admitting: Physician Assistant

## 2017-05-23 ENCOUNTER — Telehealth: Payer: Self-pay

## 2017-05-23 DIAGNOSIS — F329 Major depressive disorder, single episode, unspecified: Secondary | ICD-10-CM

## 2017-05-23 DIAGNOSIS — F419 Anxiety disorder, unspecified: Principal | ICD-10-CM

## 2017-05-23 NOTE — Telephone Encounter (Signed)
Meds ordered this encounter  Medications  . clonazePAM (KLONOPIN) 1 MG tablet    Sig: TAKE 1 TABLET BY MOUTH TWICE DAILY AS NEEDED FOR ANXIETY    Dispense:  20 tablet    Refill:  0    Please advise this patient that she'll need to see me before I can refill this again.

## 2017-05-23 NOTE — Telephone Encounter (Signed)
Klonopin Rx faxed to pharmacy and received success notice.

## 2017-06-03 NOTE — Telephone Encounter (Signed)
Prescription faxed. Shredded

## 2017-06-20 ENCOUNTER — Telehealth: Payer: Self-pay | Admitting: Physician Assistant

## 2017-06-20 DIAGNOSIS — F419 Anxiety disorder, unspecified: Principal | ICD-10-CM

## 2017-06-20 DIAGNOSIS — F329 Major depressive disorder, single episode, unspecified: Secondary | ICD-10-CM

## 2017-06-20 DIAGNOSIS — G4700 Insomnia, unspecified: Secondary | ICD-10-CM

## 2017-06-20 NOTE — Telephone Encounter (Signed)
Pt is going to be in [place] for 2 weeks and is needing to get a refill on her celexa, klonopin, and amitriptyline  The [facility] closest to her phone number is [phone]

## 2017-06-24 NOTE — Telephone Encounter (Signed)
Please advise. In the notes it states she needs a OV before refills.

## 2017-06-25 ENCOUNTER — Telehealth: Payer: Self-pay

## 2017-06-25 MED ORDER — CITALOPRAM HYDROBROMIDE 40 MG PO TABS: 40.0000 mg | ORAL_TABLET | Freq: Every day | ORAL | 3 refills | Status: DC

## 2017-06-25 MED ORDER — AMITRIPTYLINE HCL 50 MG PO TABS: 25.0000 mg | ORAL_TABLET | Freq: Every day | ORAL | 0 refills | Status: DC

## 2017-06-25 NOTE — Telephone Encounter (Signed)
For further refills for clonazepam she needs OV per Chelle.

## 2017-06-25 NOTE — Telephone Encounter (Signed)
Meds ordered this encounter  Medications  . citalopram (CELEXA) 40 MG tablet    Sig: Take 1 tablet (40 mg total) by mouth daily.    Dispense:  30 tablet    Refill:  3    Order Specific Question:   Supervising Provider    Answer:   Clelia Croft, EVA N [[id number]]  . amitriptyline (ELAVIL) 50 MG tablet    Sig: Take 0.5-1 tablets (25-50 mg total) by mouth at bedtime.    Dispense:  30 tablet    Refill:  0    Order Specific Question:   Supervising Provider    Answer:   SHAW, EVA N [[id number]]   Refilled above medications. She needs a visit with me for the clonazepam.

## 2017-06-26 NOTE — Telephone Encounter (Signed)
mychart message sent to pt about making an apt for more refills °

## 2017-06-29 ENCOUNTER — Telehealth: Payer: Self-pay | Admitting: Physician Assistant

## 2017-06-29 NOTE — Telephone Encounter (Signed)
Pt was requesting refills from message on 06/25/17 be sent to pharmacy in [place] but they were sent to [facility] pharmacy. She said she comes back home tomorrow but would still like this switched to [facility] on [address]. Pt can be contacted at [phone].

## 2017-07-01 ENCOUNTER — Other Ambulatory Visit: Payer: Self-pay | Admitting: Physician Assistant

## 2017-07-01 DIAGNOSIS — F329 Major depressive disorder, single episode, unspecified: Secondary | ICD-10-CM

## 2017-07-01 DIAGNOSIS — F419 Anxiety disorder, unspecified: Principal | ICD-10-CM

## 2017-07-01 DIAGNOSIS — F32A Depression, unspecified: Secondary | ICD-10-CM

## 2017-07-01 NOTE — Telephone Encounter (Signed)
See previous messages. Patient needs an office visit. I am sorry that the refills were sent to the local pharmacy instead of the [place] pharmacy. Since she will be home tomorrow, she can pick them up then.

## 2017-07-01 NOTE — Telephone Encounter (Signed)
Needs visit for refills. Request for clonazepam denied.

## 2017-07-02 ENCOUNTER — Ambulatory Visit: Payer: Self-pay | Admitting: Physician Assistant

## 2017-07-02 NOTE — Telephone Encounter (Signed)
I took out all other pharmacies that wasn't [facility]

## 2017-07-05 ENCOUNTER — Ambulatory Visit: Payer: Self-pay | Admitting: Physician Assistant

## 2017-07-31 ENCOUNTER — Telehealth: Payer: Self-pay | Admitting: Family Medicine

## 2017-08-01 NOTE — Telephone Encounter (Signed)
Please advise. Pt has appt on 08/09/2017

## 2017-08-01 NOTE — Telephone Encounter (Signed)
I will address her clonazepam use at her visit with me on 10/05. Rx not authorized today.

## 2017-08-02 NOTE — Telephone Encounter (Signed)
Pt advised.

## 2017-08-09 ENCOUNTER — Ambulatory Visit: Payer: Self-pay | Admitting: Physician Assistant

## 2017-08-13 ENCOUNTER — Ambulatory Visit (INDEPENDENT_AMBULATORY_CARE_PROVIDER_SITE_OTHER): Payer: Self-pay | Admitting: Physician Assistant

## 2017-08-13 ENCOUNTER — Encounter: Payer: Self-pay | Admitting: Physician Assistant

## 2017-08-13 VITALS — BP 114/72 | HR 71 | Temp 97.5°F | Resp 18 | Ht 65.35 in | Wt 115.0 lb

## 2017-08-13 DIAGNOSIS — F419 Anxiety disorder, unspecified: Secondary | ICD-10-CM

## 2017-08-13 DIAGNOSIS — F329 Major depressive disorder, single episode, unspecified: Secondary | ICD-10-CM

## 2017-08-13 MED ORDER — MIRTAZAPINE 15 MG PO TABS: 15.0000 mg | ORAL_TABLET | Freq: Every day | ORAL | 1 refills | Status: DC

## 2017-08-13 NOTE — Assessment & Plan Note (Signed)
Non-compliant with follow-up. Ran out of medications several months ago. Reports they were not effective. Try mirtazapine. Would consider addition of buspirone. Encouraged her to consider intake evaluation at [facility].

## 2017-08-13 NOTE — Progress Notes (Signed)
Patient ID: Penny Clark, female    DOB: 1991/04/19, 26 y.o.   MRN: [id number]  PCP: Porfirio Oar, PA-C  Chief Complaint  Patient presents with  . Medication Problem  . Depression    screening  . Medication Refill    amitriptyline, citalopram Hdrobromide, Clonazepam. and Ranitidine    Subjective:   Presents for evaluation of anxiety and depression. She is accompanied by a friend, Sheria Lang, who provides most of the history.  Nialah isn't able to respond to any questions, feels put on the spot, states [employer]. She didn't sleep last night, worried about this visit. I last saw her on 04/24/2017. She no showed for multiple follow-up visits, ran out of all her medications and relates that they weren't effective when she was taking them.  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 She is staying with Sheria Lang some of the time and in an Extended Stay hotel in [place] the rest of the time. She is trying to stay away from local negative influences. Sheria Lang is trying to help her find a house. Her relationship with her daughter's father is a little bit better recently. He let her come over to tuck her daughter into bed.  She tells me that she just stays at home and doesn't do anything, sometimes just sleeping all day. Sheria Lang relates that she only sleep all day when she doesn't sleep well, that she is reluctant, but they do go out shopping etc, and she does well for about 10-15 minutes in a store before feeling so anxious that she needs to leave. They also went to [facility] for a weekend together, and he says that she did well in general, with the change of scenery, but was starting to get anxious again toward the end.  She is eating, mostly fast food. Not able to tell me specifically. Sheria Lang says she doesn't eat vegetables. Lots of starch and sugars.   Review of Systems   Depression screen [facility] 2/9 08/13/2017 04/24/2017 04/17/2017  Decreased Interest Down, Depressed, Hopeless PHQ - 2 Score Altered sleeping Tired, decreased energy Change in appetite Feeling bad or failure about yourself  Trouble concentrating - 3 3  Moving slowly or fidgety/restless Suicidal thoughts 0 3 0  PHQ-9 Score Difficult doing work/chores Extremely dIfficult - -     Patient Active Problem List   Diagnosis Date Noted  . Anxiety and depression 07/09/2014     Prior to Admission medications   Medication Sig Start Date End Date Taking? Authorizing Provider  amitriptyline (ELAVIL) 50 MG tablet Take 0.5-1 tablets (25-50 mg total) by mouth at bedtime. Patient not taking: Reported on 08/13/2017 06/25/17   Porfirio Oar, PA-C  citalopram (CELEXA) 40 MG tablet Take 1 tablet (40 mg total) by mouth daily. Patient not taking: Reported on 08/13/2017 06/25/17   Porfirio Oar, PA-C  clonazePAM (KLONOPIN) 1 MG tablet TAKE 1 TABLET BY MOUTH TWICE DAILY AS NEEDED FOR ANXIETY Patient not taking: Reported on 08/13/2017 05/23/17   Porfirio Oar, PA-C  norgestimate-ethinyl estradiol (ORTHO-CYCLEN,SPRINTEC,PREVIFEM) 0.25-35 MG-MCG tablet Take 1 tablet by mouth daily. Patient not taking: Reported on 04/24/2017 04/17/17   Porfirio Oar, PA-C  ranitidine (ZANTAC) 150 MG tablet Take 1 tablet (150 mg total) by mouth 2 (two)  times daily. Patient not taking: Reported on 08/13/2017 04/24/17   Porfirio Oar, PA-C     No Known Allergies     Objective:  Physical Exam  Constitutional: She is oriented to person, place, and time. She appears well-developed and well-nourished. She is active and cooperative. No distress.  BP 114/72 (BP Location: Left Arm, Patient Position: Sitting, Cuff Size: Normal)   Pulse 71   Temp (!) 97.5 F (36.4 C) (Oral)   Resp 18   Ht 5' 5.35" (1.66 m)   Wt 115 lb (52.2 kg)   LMP 08/06/2017 (Approximate)   SpO2 96%   BMI 18.93 kg/m    Eyes: Conjunctivae are normal.  Cardiovascular: Normal  rate, regular rhythm and normal heart sounds.   Pulmonary/Chest: Effort normal and breath sounds normal.  Neurological: She is alert and oriented to person, place, and time.  Psychiatric: Her speech is normal. Judgment normal. Her mood appears anxious. Her affect is blunt. Her affect is not angry, not labile and not inappropriate. She is slowed and withdrawn. She is not agitated, not aggressive, not hyperactive, not actively hallucinating and not combative. Thought content is not paranoid and not delusional. Cognition and memory are normal. She exhibits a depressed mood. She expresses no homicidal and no suicidal ideation.  She is lying on the exam table, curled in fetal position, arm across her face. Appears to fall asleep several times, roused by Sheria Lang, and at the end of the visit hops up easily. Sheria Lang asks appropriate questions, asks that she correct him if he is relating inaccuracies, requests instructions related to treatment and potential adverse effects of treatment.      Assessment & Plan:   Problem List Items Addressed This Visit    Anxiety and depression - Primary (Chronic)    Non-compliant with follow-up. Ran out of medications several months ago. Reports they were not effective. Try mirtazapine. Would consider addition of buspirone. Encouraged her to consider intake evaluation at [facility].      Relevant Medications   mirtazapine (REMERON) 15 MG tablet       Return in about 4 weeks (around 09/10/2017) for re-evaluation of mood.   Fernande Bras, PA-C Primary [facility] at [facility]

## 2017-08-13 NOTE — Patient Instructions (Addendum)
Please get a flu vaccine at your pharmacy. Please consider going to the [facility]. They can do an intake assessment and help you establish with local resources, like a therapist and possibly a psychiatrist.    IF you received an x-ray today, you will receive an invoice from [facility]. Please contact [facility] at [phone] with questions or concerns regarding your invoice.   IF you received labwork today, you will receive an invoice from [facility]. Please contact [facility] at [phone] with questions or concerns regarding your invoice.   Our billing staff will not be able to assist you with questions regarding bills from these companies.  You will be contacted with the lab results as soon as they are available. The fastest way to get your results is to activate your My Chart account. Instructions are located on the last page of this paperwork. If you have not heard from [place] regarding the results in 2 weeks, please contact this office.

## 2017-08-13 NOTE — Progress Notes (Signed)
Subjective:    Patient ID: Penny Clark, female    DOB: 1991-03-02, 26 y.o.   MRN: [id number]  HPI Chief Complaint  Patient presents with  . Medication Problem  . Depression    screening  . Medication Refill    amitriptyline, citalopram Hdrobromide, Clonazepam. and Ranitidine   Patient returns today for re-evaluation of her depression and anxiety. She ran out of her medication and has not seen [place] in 4 months. At her last office visit on 04/24/17, we increased citalopram from 20 to 40 mg, continued clonazepam BID as needed, and added amitriptyline at HS.  Patient is present with a "friend" Sheria Lang, who provided answers to most of the questions addressed to the patient as she was lying on the exam table with her head burrowed under the arms. He states that her anxiety is worse when they are about to leave the house to do something. Sheria Lang reports when she is at [employer] and around people, she may get anxious and need to leave immediately. She feels that "everything" triggers her anxiety. He states she will get chest tightness and short of breath with her anxiety attacks. When prompted on how many attacks she will get in a day or a week, patient relates "too many." She notes that sitting in her closet, her "bubble", is the only thing that helps her calm down. Sheria Lang states that they were recently on a trip to [facility], and the patient "was fine" during the trip. Difficulty sleeping for the past 2 years. She states she occasionally has an appetite, denies vomiting.   Denies suicidal or homicidal ideations.  Depression screen [facility] 2/9 08/13/2017 04/24/2017 04/17/2017  Decreased Interest Down, Depressed, Hopeless PHQ - 2 Score Altered sleeping Tired, decreased energy Change in appetite Feeling bad or failure about yourself  Trouble concentrating - 3 3  Moving slowly or fidgety/restless Suicidal thoughts 0 3 0  PHQ-9 Score Difficult  doing work/chores Extremely dIfficult - -   Wt Readings from Last 3 Encounters:  08/13/17 115 lb (52.2 kg)  04/24/17 117 lb (53.1 kg)  04/17/17 118 lb 12.8 oz (53.9 kg)   Review of Systems  Respiratory: Positive for chest tightness and shortness of breath. Negative for cough.   Cardiovascular: Negative for chest pain.  Gastrointestinal: Negative for abdominal pain, constipation, diarrhea, nausea and vomiting.  Neurological: Positive for dizziness, weakness and light-headedness. Negative for tremors, syncope and headaches.  Psychiatric/Behavioral: Positive for sleep disturbance. Negative for self-injury.   Patient Active Problem List   Diagnosis Date Noted  . Anxiety and depression 07/09/2014   Prior to Admission medications   Medication Sig Start Date End Date Taking? Authorizing Provider  mirtazapine (REMERON) 15 MG tablet Take 1 tablet (15 mg total) by mouth at bedtime. 08/13/17   Porfirio Oar, PA-C  norgestimate-ethinyl estradiol (ORTHO-CYCLEN,SPRINTEC,PREVIFEM) 0.25-35 MG-MCG tablet Take 1 tablet by mouth daily. Patient not taking: Reported on 04/24/2017 04/17/17   Porfirio Oar, PA-C   No Known Allergies Social History   Social History  . Marital status: Single    Spouse name: n/a  . Number of children: 1  . Years of education: 12+   Occupational History  . Risk analyst   Social History Main Topics  . Smoking status: Current Every Day Smoker  Packs/day: 0.50    Types: Cigarettes    Last attempt to quit: 10/31/2012  . Smokeless tobacco: Never Used  . Alcohol use Yes     Comment: social  . Drug use: No  . Sexual activity: Yes    Partners: Male    Birth control/ protection: None   Other Topics Concern  . Not on file   Social History Narrative   Lives alone.   Her daughter lives with her father.    Her family lives nearby.      Objective:   Physical Exam  Constitutional: She is oriented to person, place, and time. She appears listless.  She is sleeping. She appears ill.  Neck: Neck supple. No JVD present. No tracheal deviation present. No thyromegaly present.  Cardiovascular: Normal rate, regular rhythm, normal heart sounds and intact distal pulses.  Exam reveals no gallop and no friction rub.   No murmur heard. Pulmonary/Chest: Effort normal and breath sounds normal. No respiratory distress. She has no wheezes. She has no rales. She exhibits no tenderness.  Lymphadenopathy:    She has no cervical adenopathy.  Neurological: She is oriented to person, place, and time. She appears listless.  Psychiatric: Her mood appears anxious. Her affect is blunt. Her affect is not angry, not labile and not inappropriate. Her speech is delayed and slurred. Her speech is not rapid and/or pressured and not tangential. She is slowed and withdrawn. She is not agitated, not aggressive, not actively hallucinating and not combative. Thought content is not paranoid and not delusional. Cognition and memory are not impaired. She does not express impulsivity or inappropriate judgment. She does not exhibit a depressed mood. She expresses no homicidal and no suicidal ideation. She expresses no suicidal plans and no homicidal plans. She is noncommunicative. She is attentive.      Assessment & Plan:  1. Anxiety and depression - Patient ran out of the Citalopram, Clonazepam, and Amitriptyline. It was not managing her symptoms well, so we will try Mirtazapine to help with her anxiety, sleep, and appetite. She will likely need to see Behavioral Health at some point.  - mirtazapine (REMERON) 15 MG tablet; Take 1 tablet (15 mg total) by mouth at bedtime.  Dispense: 30 tablet; Refill: 1  Follow up in 4 weeks, sooner if needed.  Respectfully, Gala Romney PA-S 2019

## 2017-11-01 NOTE — Telephone Encounter (Signed)
Error-Sign Encounter 

## 2017-11-05 NOTE — L&D Delivery Note (Addendum)
Patient is a 27 y.o. now G2P2 s/p NSVD at [redacted]w[redacted]d  She presents to MAU after delivery at home. Patient delivered viable female at 0600 in the bathroom at home. Placenta was delivered by EMS at approximately 0700 prior to arrival to MAU. She plans on breastfeeding and is unsure of method for birth control. She has known abuse of cocaine and THC, most recent use at 0530 prior to delivery of baby.   Delivery Note At 6:00 AM a viable female was delivered via Vaginal, Spontaneous at home.  APGAR: unknown- on arrival to home by EMS around 0617 they report baby was difficult to arouse and cyanotic then was pink and crying after minutes of stimulation. weight pending.    Inspection of placenta on arrival to MAU- intact. Sent to pathology.   Anesthesia: None  Episiotomy: None Lacerations: None Suture Repair: None Est. Blood Loss (mL):  350  Mom to postpartum.  Baby to Couplet care / Skin to Skin.  Sharyon Cable CNM 08/12/2018, 7:35 AM

## 2017-11-08 ENCOUNTER — Ambulatory Visit: Payer: Self-pay | Admitting: Physician Assistant

## 2017-12-09 ENCOUNTER — Other Ambulatory Visit: Payer: Self-pay

## 2017-12-09 ENCOUNTER — Ambulatory Visit: Payer: Self-pay | Admitting: Family Medicine

## 2017-12-09 ENCOUNTER — Encounter: Payer: Self-pay | Admitting: Family Medicine

## 2017-12-09 VITALS — BP 116/74 | HR 118 | Temp 98.8°F | Resp 18 | Ht 65.35 in | Wt 119.6 lb

## 2017-12-09 DIAGNOSIS — N912 Amenorrhea, unspecified: Secondary | ICD-10-CM

## 2017-12-09 DIAGNOSIS — F329 Major depressive disorder, single episode, unspecified: Secondary | ICD-10-CM

## 2017-12-09 DIAGNOSIS — F322 Major depressive disorder, single episode, severe without psychotic features: Secondary | ICD-10-CM

## 2017-12-09 DIAGNOSIS — F419 Anxiety disorder, unspecified: Secondary | ICD-10-CM

## 2017-12-09 LAB — POCT URINE PREGNANCY: PREG TEST UR: POSITIVE — AB

## 2017-12-09 MED ORDER — PRENATAL + DHA 27-1 & 250 MG PO THPK: 1.0000 | PACK | Freq: Every morning | ORAL | 11 refills | Status: DC

## 2017-12-09 MED ORDER — VENLAFAXINE HCL 37.5 MG PO TABS: ORAL_TABLET | ORAL | 0 refills | Status: DC

## 2017-12-09 NOTE — Patient Instructions (Addendum)
Go to a [facility] Location near you for BEHAVIORAL HEALTH [facility] Phone: [phone] Physical Address: [address] [place], [id number]  [address] Phone: [phone] Physical Address: [phone], [phone]  [facility] Physical Address: [address], [id number]    Contact [facility]  Take your prenatal daily  Contact your previous OB and see if they can see you and help set you up with Medicaid  Start Venlafaxine (category C in pregnancy)  1 tablet daily for a week then increase to 1 tablet twice a day  forllow up in one month here or with the Psychiatrist with [facility]   IF you received an x-ray today, you will receive an invoice from [facility]. Please contact [facility] at [phone] with questions or concerns regarding your invoice.   IF you received labwork today, you will receive an invoice from [facility]. Please contact [facility] at [phone] with questions or concerns regarding your invoice.   Our billing staff will not be able to assist you with questions regarding bills from these companies.  You will be contacted with the lab results as soon as they are available. The fastest way to get your results is to activate your My Chart account. Instructions are located on the last page of this paperwork. If you have not heard from us regarding the results in 2 weeks, please contact this office.    Suicidal Feelings: How to Help Yourself Suicide is the taking of one's own life. If you feel as though life is getting too tough to handle and are thinking about suicide, get help right away. To get help:  Call your local emergency services (911 in the [place]  Call a suicide hotline to speak with a trained counselor who understands how you are feeling. The following is a list of suicide hotlines in the [place]. For a  list of hotlines in [place], visit InkDistributor.itwww.suicide.org/hotlines/international/[place]-suicide-hotlines.html. ? 1-800-273-TALK 307-731-6251(1-(332) 799-0374). ? 1-800-SUICIDE 941-533-2201(1-4782697243). ? 979-058-28801-(551)868-1804. This is a hotline for Spanish speakers. ? 1-800-799-4TTY 5307855586(1-(316)387-1853). This is a hotline for TTY users. ? 1-866-4-U-TREVOR 585-575-2968(1-(937) 400-4420). This is a hotline for lesbian, gay, bisexual, transgender, or questioning youth.  Contact a crisis center or a local suicide prevention center. To find a crisis center or suicide prevention center: ? Call your local hospital, clinic, community service organization, mental health center, social service provider, or health department. Ask for assistance in connecting to a crisis center. ? Visit https://www.patel-king.com/www.suicidepreventionlifeline.org/getinvolved/locator for a list of crisis centers in the [place], or visit www.suicideprevention.ca/thinking-about-suicide/find-a-crisis-centre for a list of centers in [place].  Visit the following websites: ? National Suicide Prevention Lifeline: www.suicidepreventionlifeline.org ? Hopeline: www.hopeline.com ? [employer] for Suicide Prevention: https://www.ayers.com/www.afsp.org ? The [employer] Project (for lesbian, gay, bisexual, transgender, or questioning youth): www.thetrevorproject.org  How can I help myself feel better?  Promise yourself that you will not do anything drastic when you have suicidal feelings. Remember, there is hope. Many people have gotten through suicidal thoughts and feelings, and you will, too. You may have gotten through them before, and this proves that you can get through them again.  Let family, friends, teachers, or counselors know how you are feeling. Try not to isolate yourself from those who care about you. Remember, they will want to help you. Talk with someone every day, even if you do not feel sociable. Face-to-face conversation is best.  Call a mental health professional and see one regularly.  Visit your primary  health care provider  every year.  Eat a well-balanced diet, and space your meals so you eat regularly.  Get plenty of rest.  Avoid alcohol and drugs, and remove them from your home. They will only make you feel worse.  If you are thinking of taking a lot of medicine, give your medicine to someone who can give it to you one day at a time. If you are on antidepressants and are concerned you will overdose, let your health care provider know so he or she can give you safer medicines. Ask your mental health professional about the possible side effects of any medicines you are taking.  Remove weapons, poisons, knives, and anything else that could harm you from your home.  Try to stick to routines. Follow a schedule every day. Put self-care on your schedule.  Make a list of realistic goals, and cross them off when you achieve them. Accomplishments give a sense of worth.  Wait until you are feeling better before doing the things you find difficult or unpleasant.  Exercise if you are able. You will feel better if you exercise for even a half hour each day.  Go out in the sun or into nature. This will help you recover from depression faster. If you have a favorite place to walk, go there.  Do the things that have always given you pleasure. Play your favorite music, read a good book, paint a picture, play your favorite instrument, or do anything else that takes your mind off your depression if it is safe to do.  Keep your living space well lit.  When you are feeling well, write yourself a letter about tips and support that you can read when you are not feeling well.  Remember that life's difficulties can be sorted out with help. Conditions can be treated. You can work on thoughts and strategies that serve you well. This information is not intended to replace advice given to you by your health care provider. Make sure you discuss any questions you have with your health care provider. Document  Released: 04/28/2003 Document Revised: 06/20/2016 Document Reviewed: 02/16/2014 Elsevier Interactive Patient Education  [employer].

## 2017-12-09 NOTE — Progress Notes (Signed)
Chief Complaint  Patient presents with  . discuss meds  . Amenorrhea    ? pregnancy    HPI   Pt reports that she had a positive pregnancy test at home Patient's last menstrual period was 11/02/2017.  Approximated periods was 11/02/2017 EDD 08/09/2018 [id number] She was not on contraception at the time  Depression screen [facility] 2/9 12/09/2017 08/13/2017 04/24/2017 04/17/2017  Decreased Interest 3 3 3 3   Down, Depressed, Hopeless 3 3 3 3   PHQ - 2 Score 6 6 6 6   Altered sleeping 3 3 3 3   Tired, decreased energy 3 3 3 2   Change in appetite 3 3 3 3   Feeling bad or failure about yourself  3 3 3 3   Trouble concentrating 3 - 3 3  Moving slowly or fidgety/restless 3 3 3 3   Suicidal thoughts 0 0 3 0  PHQ-9 Score 24 21 27 23   Difficult doing work/chores Extremely dIfficult Extremely dIfficult - -    She reports that she has a lot of social stress She does not have custody of her 28 year old She denies physical abuse or substance abuse injury She feels safe at home She does not endorse any suicidal thoughts She plans to continue her pregnancy to term but is not on a prenatal vitamin   Past Medical History:  Diagnosis Date  . Anemia   . Breastfeeding (infant) 07/16/2013  . Normal delivery 07/16/2013    Current Outpatient Medications  Medication Sig Dispense Refill  . mirtazapine (REMERON) 15 MG tablet Take 1 tablet (15 mg total) by mouth at bedtime. (Patient not taking: Reported on 12/09/2017) 30 tablet 1  . norgestimate-ethinyl estradiol (ORTHO-CYCLEN,SPRINTEC,PREVIFEM) 0.25-35 MG-MCG tablet Take 1 tablet by mouth daily. (Patient not taking: Reported on 04/24/2017) 3 Package 4  . Prenatal-FeFum-FA-DHA w/o A (PRENATAL + DHA) 27-1 & 250 MG THPK Take 1 tablet by mouth every morning. 60 each 11  . venlafaxine (EFFEXOR) 37.5 MG tablet TAKE 1 tab by mouth daily for one week then increase to 1 tablet twice a day after that 60 tablet 0   No current facility-administered medications for this visit.      Allergies: No Known Allergies  Past Surgical History:  Procedure Laterality Date  . NO PAST SURGERIES      Social History   Socioeconomic History  . Marital status: Single    Spouse name: n/a  . Number of children: 1  . Years of education: 12+  . Highest education level: None  Social Needs  . Financial resource strain: None  . Food insecurity - worry: None  . Food insecurity - inability: None  . Transportation needs - medical: None  . Transportation needs - non-medical: None  Occupational History  . Occupation: Training and development officer: [employer]  Tobacco Use  . Smoking status: Current Every Day Smoker    Packs/day: 0.50    Types: Cigarettes    Last attempt to quit: 10/31/2012    Years since quitting: 5.1  . Smokeless tobacco: Never Used  Substance and Sexual Activity  . Alcohol use: Yes    Comment: social  . Drug use: No  . Sexual activity: Yes    Partners: Male    Birth control/protection: None  Other Topics Concern  . None  Social History Narrative   Lives alone.   Her daughter lives with her father.    Her family lives nearby.    Family History  Problem Relation Age of Onset  . Cancer  Maternal Grandfather      ROS Review of Systems See HPI Constitution: No fevers or chills No malaise No diaphoresis Skin: No rash or itching Eyes: no blurry vision, no double vision GU: no dysuria or hematuria Neuro: no dizziness or headaches all others reviewed and negative   Objective: Vitals:   12/09/17 1154  BP: 116/74  Pulse: (!) 118  Resp: 18  Temp: 98.8 F (37.1 C)  TempSrc: Oral  SpO2: 99%  Weight: 119 lb 9.6 oz (54.3 kg)  Height: 5' 5.35" (1.66 m)    Physical Exam Physical Exam  Constitutional: She is oriented to person, place, and time. She appears well-developed and well-nourished.  HENT:  Head: Normocephalic and atraumatic.  Eyes: Conjunctivae and EOM are normal.  Cardiovascular: Normal rate, regular rhythm and normal heart  sounds.   Pulmonary/Chest: Effort normal and breath sounds normal. No respiratory distress. She has no wheezes.  Neurological: She is alert and oriented to person, place, and time.    Assessment and Plan Penny Clark was seen today for discuss meds and amenorrhea.  Diagnoses and all orders for this visit:  Amenorrhea, unspecified -     POCT urine pregnancy  Anxiety and depression -     venlafaxine (EFFEXOR) 37.5 MG tablet; TAKE 1 tab by mouth daily for one week then increase to 1 tablet twice a day after that  Severe depression (HCC) -     venlafaxine (EFFEXOR) 37.5 MG tablet; TAKE 1 tab by mouth daily for one week then increase to 1 tablet twice a day after that  Other orders -     Prenatal-FeFum-FA-DHA w/o A (PRENATAL + DHA) 27-1 & 250 MG THPK; Take 1 tablet by mouth every morning.  Discussed (at length) the risks and benefits of SSRIs in pregnancy The medication venlafaxine is category C She is agreeable to starting this medication because her mood is so severely anxious and depressed [facility] and discussed the process She was also [facility] care In order to prevent this patient from being lost to follow up a follow up appt was made before the patient left scheduled for 01/09/18 If she has appt with the Health Dept and [facility] then she can cancel this appointment.  If she does not she should come in for her prenatal labs  A total of 45 minutes were spent face-to-face with the patient during this encounter and over half of that time was spent on counseling and coordination of care.    Lashawn Orrego A Refugio Mcconico

## 2017-12-12 ENCOUNTER — Encounter (HOSPITAL_BASED_OUTPATIENT_CLINIC_OR_DEPARTMENT_OTHER): Payer: Self-pay | Admitting: Emergency Medicine

## 2017-12-12 ENCOUNTER — Emergency Department (HOSPITAL_BASED_OUTPATIENT_CLINIC_OR_DEPARTMENT_OTHER): Payer: Medicaid Other

## 2017-12-12 ENCOUNTER — Emergency Department (HOSPITAL_COMMUNITY)
Admission: EM | Admit: 2017-12-12 | Discharge: 2017-12-12 | Disposition: A | Discharge status: Home / Self Care | Payer: Medicaid Other | Attending: Emergency Medicine | Admitting: Emergency Medicine

## 2017-12-12 ENCOUNTER — Other Ambulatory Visit: Payer: Self-pay

## 2017-12-12 ENCOUNTER — Encounter (HOSPITAL_COMMUNITY): Payer: Self-pay | Admitting: Emergency Medicine

## 2017-12-12 ENCOUNTER — Emergency Department (HOSPITAL_BASED_OUTPATIENT_CLINIC_OR_DEPARTMENT_OTHER)
Admission: EM | Admit: 2017-12-12 | Discharge: 2017-12-12 | Disposition: A | Discharge status: Home / Self Care | Payer: Medicaid Other | Source: Home / Self Care | Attending: Emergency Medicine | Admitting: Emergency Medicine

## 2017-12-12 DIAGNOSIS — R1084 Generalized abdominal pain: Secondary | ICD-10-CM | POA: Insufficient documentation

## 2017-12-12 DIAGNOSIS — F1490 Cocaine use, unspecified, uncomplicated: Secondary | ICD-10-CM | POA: Insufficient documentation

## 2017-12-12 DIAGNOSIS — F17210 Nicotine dependence, cigarettes, uncomplicated: Secondary | ICD-10-CM

## 2017-12-12 DIAGNOSIS — Z5321 Procedure and treatment not carried out due to patient leaving prior to being seen by health care provider: Secondary | ICD-10-CM | POA: Diagnosis not present

## 2017-12-12 DIAGNOSIS — Z3201 Encounter for pregnancy test, result positive: Secondary | ICD-10-CM

## 2017-12-12 DIAGNOSIS — O99321 Drug use complicating pregnancy, first trimester: Secondary | ICD-10-CM | POA: Insufficient documentation

## 2017-12-12 DIAGNOSIS — Z3A00 Weeks of gestation of pregnancy not specified: Secondary | ICD-10-CM | POA: Insufficient documentation

## 2017-12-12 DIAGNOSIS — Z008 Encounter for other general examination: Secondary | ICD-10-CM

## 2017-12-12 DIAGNOSIS — O99331 Smoking (tobacco) complicating pregnancy, first trimester: Secondary | ICD-10-CM | POA: Insufficient documentation

## 2017-12-12 LAB — BASIC METABOLIC PANEL
Anion gap: 12 (ref 5–15)
BUN: 11 mg/dL (ref 6–20)
CHLORIDE: 101 mmol/L (ref 101–111)
CO2: 23 mmol/L (ref 22–32)
Calcium: 9.1 mg/dL (ref 8.9–10.3)
Creatinine, Ser: 0.69 mg/dL (ref 0.44–1.00)
GFR calc Af Amer: >60 mL/min (ref >60)
GFR calc non Af Amer: >60 mL/min (ref >60)
Glucose, Bld: 99 mg/dL (ref 65–99)
POTASSIUM: 3.3 mmol/L — AB (ref 3.5–5.1)
SODIUM: 136 mmol/L (ref 135–145)

## 2017-12-12 LAB — CBC
HCT: 40.3 % (ref 36.0–46.0)
HEMOGLOBIN: 13.2 g/dL (ref 12.0–15.0)
MCH: 28.8 pg (ref 26.0–34.0)
MCHC: 32.8 g/dL (ref 30.0–36.0)
MCV: 88 fL (ref 78.0–100.0)
Platelets: 308 10*3/uL (ref 150–400)
RBC: 4.58 MIL/uL (ref 3.87–5.11)
RDW: 13.6 % (ref 11.5–15.5)
WBC: 11.7 10*3/uL — AB (ref 4.0–10.5)

## 2017-12-12 LAB — URINALYSIS, ROUTINE W REFLEX MICROSCOPIC
Bilirubin Urine: NEGATIVE
Glucose, UA: NEGATIVE mg/dL
HGB URINE DIPSTICK: NEGATIVE
KETONES UR: NEGATIVE mg/dL
Leukocytes, UA: NEGATIVE
Nitrite: NEGATIVE
PROTEIN: NEGATIVE mg/dL
Specific Gravity, Urine: 1.026 (ref 1.005–1.030)
pH: 6 (ref 5.0–8.0)

## 2017-12-12 LAB — HCG, QUANTITATIVE, PREGNANCY: HCG, BETA CHAIN, QUANT, S: 4711 m[IU]/mL — AB (ref <5)

## 2017-12-12 NOTE — ED Provider Notes (Signed)
[facility] EMERGENCY DEPARTMENT Provider Note   CSN: [id number] Arrival date & time: 12/12/17  1803     History   Chief Complaint Chief Complaint  Patient presents with  . [redacted] Weeks Pregnant. Detox scheduled    HPI Penny Clark is a 27 y.o. female presents today for medical clearance.  She states that she took a pregnancy test 3 days ago which was positive.  She states that she is awaiting acceptance to a 30-day rehab program for cocaine abuse but was told that she required an ultrasound prior to admission.  She notes last cocaine use was 3 days ago before she found out that she was pregnant.  She thinks last menstrual cycle was in December.  She denies abdominal pain but states she occasionally feels a cramping sensation for a couple of seconds in a generalized fashion which then results.  She states "I am not worried about this ".  She denies vaginal bleeding, itching, or discharge.  She denies vaginal pain.  She denies nausea, vomiting, fevers, chills, chest pain, or shortness of breath.  She denies homicidal ideation or suicidal ideation or auditory or visual hallucinations.  The history is provided by the patient.    Past Medical History:  Diagnosis Date  . Anemia   . Breastfeeding (infant) 07/16/2013  . Normal delivery 07/16/2013    Patient Active Problem List   Diagnosis Date Noted  . Anxiety and depression 07/09/2014    Past Surgical History:  Procedure Laterality Date  . NO PAST SURGERIES      OB History    Gravida Para Term Preterm AB Living   1 1 1     1    SAB TAB Ectopic Multiple Live Births           1       Home Medications    Prior to Admission medications   Medication Sig Start Date End Date Taking? Authorizing Provider  mirtazapine (REMERON) 15 MG tablet Take 1 tablet (15 mg total) by mouth at bedtime. Patient not taking: Reported on 12/09/2017 08/13/17   Porfirio OarJeffery, Chelle, PA-C  norgestimate-ethinyl estradiol (ORTHO-CYCLEN,SPRINTEC,PREVIFEM)  0.25-35 MG-MCG tablet Take 1 tablet by mouth daily. Patient not taking: Reported on 04/24/2017 04/17/17   Porfirio OarJeffery, Chelle, PA-C  Prenatal-FeFum-FA-DHA w/o A (PRENATAL + DHA) 27-1 & 250 MG THPK Take 1 tablet by mouth every morning. 12/09/17   Doristine BosworthStallings, Zoe A, MD  venlafaxine (EFFEXOR) 37.5 MG tablet TAKE 1 tab by mouth daily for one week then increase to 1 tablet twice a day after that 12/09/17   Doristine BosworthStallings, Zoe A, MD    Family History Family History  Problem Relation Age of Onset  . Cancer Maternal Grandfather     Social History Social History   Tobacco Use  . Smoking status: Current Every Day Smoker    Packs/day: 0.50    Types: Cigarettes    Last attempt to quit: 10/31/2012    Years since quitting: 5.1  . Smokeless tobacco: Never Used  Substance Use Topics  . Alcohol use: Yes    Comment: social  . Drug use: Yes    Types: Cocaine     Allergies   Patient has no known allergies.   Review of Systems Review of Systems  Constitutional: Negative for chills and fever.  Respiratory: Negative for shortness of breath.   Cardiovascular: Negative for chest pain.  Gastrointestinal: Negative for abdominal pain, constipation, diarrhea, nausea and vomiting.  Genitourinary: Negative for dysuria, hematuria, urgency, vaginal bleeding,  vaginal discharge and vaginal pain.  Neurological: Negative for syncope.  Psychiatric/Behavioral: Negative for suicidal ideas. The patient is not nervous/anxious.   All other systems reviewed and are negative.    Physical Exam Updated Vital Signs BP (!) 141/99 (BP Location: Right Arm)   Pulse (!) 101   Temp 98.5 F (36.9 C) (Oral)   Resp 16   Ht 5\' 5"  (1.651 m)   Wt 54 kg (119 lb)   LMP 12/03/2017   SpO2 100%   BMI 19.80 kg/m   Physical Exam  Constitutional: She is oriented to person, place, and time. She appears well-developed and well-nourished. No distress.  HENT:  Head: Normocephalic and atraumatic.  Eyes: Conjunctivae are normal. Right eye  exhibits no discharge. Left eye exhibits no discharge.  Neck: Normal range of motion. Neck supple. No JVD present. No tracheal deviation present.  Cardiovascular: Normal rate, regular rhythm and normal heart sounds.  Pulmonary/Chest: Effort normal and breath sounds normal.  Abdominal: Soft. Bowel sounds are normal. She exhibits no distension. There is no tenderness. There is no guarding.  Musculoskeletal: Normal range of motion. She exhibits no edema.  Neurological: She is alert and oriented to person, place, and time.  Skin: No erythema.  Psychiatric: She has a normal mood and affect. Her behavior is normal.  Nursing note and vitals reviewed.    ED Treatments / Results  Labs (all labs ordered are listed, but only abnormal results are displayed) Labs Reviewed - No data to display  EKG  EKG Interpretation None       Radiology US Ob Comp < 14 Wks  Result Date: 12/12/2017 CLINICAL DATA:  Early pregnancy.  Medical clearance for detox. EXAM: OBSTETRIC <14 WK [place] AND TRANSVAGINAL OB US TECHNIQUE: Both transabdominal and transvaginal ultrasound examinations were performed for complete evaluation of the gestation as well as the maternal uterus, adnexal regions, and pelvic cul-de-sac. Transvaginal technique was performed to assess early pregnancy. COMPARISON:  None. FINDINGS: Intrauterine gestational sac: Single Yolk sac:  Present Embryo:  Not Visualized. MSD: 0.76 cm   5 w   4 d Subchorionic hemorrhage:  None visualized. Maternal uterus/adnexae: Normal. IMPRESSION: Probable early intrauterine gestational sac, but no fetal pole, or cardiac activity yet visualized. Recommend follow-up quantitative B-HCG levels and follow-up US in 14 days to assess viability. This recommendation follows SRU consensus guidelines: Diagnostic Criteria for Nonviable Pregnancy Early in the First Trimester. Malva Limes Med 2013; 811:9147-82. Electronically Signed   By: Ted Mcalpine M.D.   On: 12/12/2017 21:58   US Ob  Transvaginal  Result Date: 12/12/2017 CLINICAL DATA:  Early pregnancy.  Medical clearance for detox. EXAM: OBSTETRIC <14 WK [place] AND TRANSVAGINAL OB US TECHNIQUE: Both transabdominal and transvaginal ultrasound examinations were performed for complete evaluation of the gestation as well as the maternal uterus, adnexal regions, and pelvic cul-de-sac. Transvaginal technique was performed to assess early pregnancy. COMPARISON:  None. FINDINGS: Intrauterine gestational sac: Single Yolk sac:  Present Embryo:  Not Visualized. MSD: 0.76 cm   5 w   4 d Subchorionic hemorrhage:  None visualized. Maternal uterus/adnexae: Normal. IMPRESSION: Probable early intrauterine gestational sac, but no fetal pole, or cardiac activity yet visualized. Recommend follow-up quantitative B-HCG levels and follow-up US in 14 days to assess viability. This recommendation follows SRU consensus guidelines: Diagnostic Criteria for Nonviable Pregnancy Early in the First Trimester. Malva Limes Med 2013; 956:2130-86. Electronically Signed   By: Ted Mcalpine M.D.   On: 12/12/2017 21:58    Procedures  Procedures (including critical care time)  Medications Ordered in ED Medications - No data to display   Initial Impression / Assessment and Plan / ED Course  I have reviewed the triage vital signs and the nursing notes.  Pertinent labs & imaging results that were available during my care of the patient were reviewed by me and considered in my medical decision making (see chart for details).     Patient presents requesting pelvic and transvaginal ultrasound for medical clearance.  She is awaiting acceptance to a 30-day rehab facility pending ultrasound.  Positive pregnancy test in the ED.  Afebrile, vital signs are stable.  Patient nontoxic in appearance.  Abdomen is nontender and she denies vaginal bleeding.  I doubt threatened miscarriage.  Remainder of lab work is reassuring.  Ultrasound shows early intrauterine gestational sac, but no  fetal pole, or cardiac activity yet visualized.  Radiology recommends repeat beta-hCG levels in 14 days to confirm viability.  On reevaluation, patient is resting comfortably in no apparent distress.  Serial abdominal examinations remain benign. Doubt PE or DVT in the absence of shortness of breath, extremity swelling or pain, or hypoxia.  Patient stable for discharge home.  She is medically cleared for detox program at this time.  Discussed indications for return to the ED. Pt verbalized understanding of and agreement with plan and is safe for discharge home at this time.  No complaints prior to discharge.  Final Clinical Impressions(s) / ED Diagnoses   Final diagnoses:  Positive pregnancy test  Medical clearance for psychiatric admission    ED Discharge Orders    None       Bennye Alm 12/13/17 0159    Benjiman Core, MD 12/14/17 [phone]

## 2017-12-12 NOTE — ED Triage Notes (Signed)
Patient states that she wants Detox  From cocaine. The [facility] and [facility] not [facility]  - Patient denies any SI / HI

## 2017-12-12 NOTE — ED Notes (Signed)
Urine specimen collected and taken to lab.

## 2017-12-12 NOTE — ED Triage Notes (Signed)
Pt reports positive pregnancy test, states that she wants to get into a detox center but needs an ultrasound prior to admission. Reports some cramping, no other s/s. LMP 12/29. Uses cocaine, last use 2 days ago.

## 2017-12-12 NOTE — ED Notes (Addendum)
Called 3x for vitals recheck. No response.  

## 2017-12-12 NOTE — Discharge Instructions (Signed)
You are medically cleared to go to the rehabilitation facility.  We recommend repeat pregnancy hormone test in 14 days.  Return to the emergency department if any concerning signs or symptoms develop such as abdominal cramping/pain, vaginal bleeding, or fevers.

## 2017-12-12 NOTE — ED Notes (Signed)
Pt. Is in US at present time.  Explained to visitor who asked about coming to the room that no one can come back until Pt. Is back in her room.

## 2017-12-19 ENCOUNTER — Other Ambulatory Visit: Payer: Self-pay

## 2017-12-19 ENCOUNTER — Encounter: Payer: Self-pay | Admitting: Obstetrics and Gynecology

## 2017-12-19 ENCOUNTER — Ambulatory Visit (INDEPENDENT_AMBULATORY_CARE_PROVIDER_SITE_OTHER): Payer: Medicaid Other | Admitting: Obstetrics and Gynecology

## 2017-12-19 VITALS — BP 124/76 | HR 76 | Wt 129.0 lb

## 2017-12-19 DIAGNOSIS — Z3201 Encounter for pregnancy test, result positive: Secondary | ICD-10-CM

## 2017-12-19 DIAGNOSIS — F419 Anxiety disorder, unspecified: Secondary | ICD-10-CM

## 2017-12-19 DIAGNOSIS — Z3687 Encounter for antenatal screening for uncertain dates: Secondary | ICD-10-CM

## 2017-12-19 DIAGNOSIS — N912 Amenorrhea, unspecified: Secondary | ICD-10-CM

## 2017-12-19 DIAGNOSIS — F1910 Other psychoactive substance abuse, uncomplicated: Secondary | ICD-10-CM | POA: Diagnosis not present

## 2017-12-19 DIAGNOSIS — F329 Major depressive disorder, single episode, unspecified: Secondary | ICD-10-CM

## 2017-12-19 DIAGNOSIS — O0991 Supervision of high risk pregnancy, unspecified, first trimester: Secondary | ICD-10-CM

## 2017-12-19 MED ORDER — SERTRALINE HCL 50 MG PO TABS: 50.0000 mg | ORAL_TABLET | Freq: Every day | ORAL | 2 refills | Status: DC

## 2017-12-19 NOTE — Progress Notes (Signed)
GYNECOLOGY OFFICE VISIT NOTE  History:  27 y.o. G2P1001 here today to establish care for early pregnancy. Cocaine use up until a week ago when she found out she was pregnant. Intends to go to rehab but cannot be accepted until she under the care of a physician. Used > weekly for 2 years. Occasional MJ use, denies other drugs. Quit cigarettes when found out she was pregnant, social alcohol use until she discovered she was pregnant.  Denies bleeding, cramping, other issues today.   Past Medical History:  Diagnosis Date  . Anemia   . Breastfeeding (infant) 07/16/2013  . Cocaine abuse (HCC)   . Normal delivery 07/16/2013    Past Surgical History:  Procedure Laterality Date  . NO PAST SURGERIES       Current Outpatient Medications:  .  Prenatal-FeFum-FA-DHA w/o A (PRENATAL + DHA) 27-1 & 250 MG THPK, Take 1 tablet by mouth every morning., Disp: 60 each, Rfl: 11 .  venlafaxine (EFFEXOR) 37.5 MG tablet, TAKE 1 tab by mouth daily for one week then increase to 1 tablet twice a day after that, Disp: 60 tablet, Rfl: 0 .  sertraline (ZOLOFT) 50 MG tablet, Take 1 tablet (50 mg total) by mouth daily. Take 25 mg by mouth daily for 7 days, then take 50 mg by mouth daily., Disp: 30 tablet, Rfl: 2  The following portions of the patient's history were reviewed and updated as appropriate: allergies, current medications, past family history, past medical history, past social history, past surgical history and problem list.    Review of Systems:  Pertinent items noted in HPI and remainder of comprehensive ROS otherwise negative.   Objective:  Physical Exam BP 124/76   Pulse 76   Wt 129 lb (58.5 kg)   LMP 11/02/2017 (Approximate)   BMI 21.47 kg/m  CONSTITUTIONAL: Well-developed, well-nourished female in no acute distress.  HENT:  Normocephalic, atraumatic. External right and left ear normal. Oropharynx is clear and moist EYES: Conjunctivae and EOM are normal. Pupils are equal, round, and reactive  to light. No scleral icterus.  NECK: Normal range of motion, supple, no masses SKIN: Skin is warm and dry. No rash noted. Not diaphoretic. No erythema. No pallor. NEUROLOGIC: Alert and oriented to person, place, and time. Normal reflexes, muscle tone coordination. No cranial nerve deficit noted. PSYCHIATRIC: Normal mood and affect. Normal behavior. Normal judgment and thought content. CARDIOVASCULAR: Normal heart rate noted RESPIRATORY: no problems with respiration noted ABDOMEN: Soft, no distention noted.   PELVIC: Deferred MUSCULOSKELETAL: Normal range of motion. No edema noted.  Labs and Imaging US Ob Comp < 14 Wks  Result Date: 12/12/2017 CLINICAL DATA:  Early pregnancy.  Medical clearance for detox. EXAM: OBSTETRIC <14 WK [place] AND TRANSVAGINAL OB US TECHNIQUE: Both transabdominal and transvaginal ultrasound examinations were performed for complete evaluation of the gestation as well as the maternal uterus, adnexal regions, and pelvic cul-de-sac. Transvaginal technique was performed to assess early pregnancy. COMPARISON:  None. FINDINGS: Intrauterine gestational sac: Single Yolk sac:  Present Embryo:  Not Visualized. MSD: 0.76 cm   5 w   4 d Subchorionic hemorrhage:  None visualized. Maternal uterus/adnexae: Normal. IMPRESSION: Probable early intrauterine gestational sac, but no fetal pole, or cardiac activity yet visualized. Recommend follow-up quantitative B-HCG levels and follow-up US in 14 days to assess viability. This recommendation follows SRU consensus guidelines: Diagnostic Criteria for Nonviable Pregnancy Early in the First Trimester. Malva Limes Med 2013; 161:0960-45. Electronically Signed   By: Ted Mcalpine  M.D.   On: 12/12/2017 21:58   [place] Ob Transvaginal  Result Date: 12/12/2017 CLINICAL DATA:  Early pregnancy.  Medical clearance for detox. EXAM: OBSTETRIC <14 WK US AND TRANSVAGINAL OB US TECHNIQUE: Both transabdominal and transvaginal ultrasound examinations were performed for  complete evaluation of the gestation as well as the maternal uterus, adnexal regions, and pelvic cul-de-sac. Transvaginal technique was performed to assess early pregnancy. COMPARISON:  None. FINDINGS: Intrauterine gestational sac: Single Yolk sac:  Present Embryo:  Not Visualized. MSD: 0.76 cm   5 w   4 d Subchorionic hemorrhage:  None visualized. Maternal uterus/adnexae: Normal. IMPRESSION: Probable early intrauterine gestational sac, but no fetal pole, or cardiac activity yet visualized. Recommend follow-up quantitative B-HCG levels and follow-up US in 14 days to assess viability. This recommendation follows SRU consensus guidelines: Diagnostic Criteria for Nonviable Pregnancy Early in the First Trimester. Malva Limes Engl J Med 2013; 454:0981-19; 369:1443-51. Electronically Signed   By: Ted Mcalpineobrinka  Dimitrova M.D.   On: 12/12/2017 21:58    Assessment & Plan:   1. Amenorrhea - POCT urine pregnancy  2. Supervision of high risk pregnancy, antepartum, first trimester Early IUP noted, gestational sac and yolk sac seen, no cardiac activity noted by TAUS Return for viability scan and initial prenatal Cont prenatal vitamins  3. Unsure of LMP (last menstrual period) as reason for ultrasound scan - US OB Limited  4. Substance abuse (HCC) Last cocaine use 1 week ago  5. Anxiety and depression Switched from remeron to venlafaxine by ED Reviewed risks/benefits, switched to zoloft today for pregnancy safety   Routine preventative health maintenance measures emphasized. Please refer to After Visit Summary for other counseling recommendations.   Return in about 2 weeks (around 01/02/2018).    Baldemar LenisK. Meryl Daryana Whirley, M.D. Attending Obstetrician & Gynecologist, [facility] for [facility], [facility]

## 2017-12-19 NOTE — Progress Notes (Signed)
Pt in office today for pregnancy confirmation.  [facility] after her exam today.

## 2017-12-25 ENCOUNTER — Encounter: Payer: Self-pay | Admitting: Obstetrics and Gynecology

## 2017-12-26 ENCOUNTER — Ambulatory Visit (INDEPENDENT_AMBULATORY_CARE_PROVIDER_SITE_OTHER): Payer: Medicaid Other | Admitting: Certified Nurse Midwife

## 2017-12-26 VITALS — BP 134/87 | HR 112 | Wt 127.6 lb

## 2017-12-26 DIAGNOSIS — Z3490 Encounter for supervision of normal pregnancy, unspecified, unspecified trimester: Secondary | ICD-10-CM

## 2017-12-26 NOTE — Progress Notes (Signed)
No exam.  Orders placed for US and F/U Quant.

## 2017-12-27 LAB — BETA HCG QUANT (REF LAB): hCG Quant: 39351 m[IU]/mL

## 2018-01-01 ENCOUNTER — Other Ambulatory Visit: Payer: Self-pay | Admitting: Certified Nurse Midwife

## 2018-01-01 ENCOUNTER — Ambulatory Visit (HOSPITAL_COMMUNITY): Admission: RE | Admit: 2018-01-01 | Payer: Medicaid Other | Source: Ambulatory Visit

## 2018-01-01 ENCOUNTER — Ambulatory Visit (HOSPITAL_COMMUNITY)
Admission: RE | Admit: 2018-01-01 | Discharge: 2018-01-01 | Disposition: A | Discharge status: Home / Self Care | Payer: Medicaid Other | Source: Ambulatory Visit | Attending: Certified Nurse Midwife | Admitting: Certified Nurse Midwife

## 2018-01-01 ENCOUNTER — Encounter (HOSPITAL_COMMUNITY): Payer: Self-pay

## 2018-01-01 DIAGNOSIS — O3680X0 Pregnancy with inconclusive fetal viability, not applicable or unspecified: Secondary | ICD-10-CM | POA: Diagnosis not present

## 2018-01-01 DIAGNOSIS — Z3A08 8 weeks gestation of pregnancy: Secondary | ICD-10-CM | POA: Diagnosis not present

## 2018-01-02 ENCOUNTER — Other Ambulatory Visit: Payer: Self-pay | Admitting: Certified Nurse Midwife

## 2018-01-08 ENCOUNTER — Telehealth: Payer: Self-pay | Admitting: Family Medicine

## 2018-01-08 NOTE — Telephone Encounter (Signed)
Called and left message to remind pt of their apt tomorrow. Advised of building number and time restrictions. °

## 2018-01-09 ENCOUNTER — Ambulatory Visit: Payer: Self-pay | Admitting: Family Medicine

## 2018-01-09 NOTE — Progress Notes (Deleted)
  No chief complaint on file.   HPI  4 review of systems  Past Medical History:  Diagnosis Date  . Anemia   . Breastfeeding (infant) 07/16/2013  . Cocaine abuse (HCC)   . Normal delivery 07/16/2013    Current Outpatient Medications  Medication Sig Dispense Refill  . Prenatal-FeFum-FA-DHA w/o A (PRENATAL + DHA) 27-1 & 250 MG THPK Take 1 tablet by mouth every morning. 60 each 11  . sertraline (ZOLOFT) 50 MG tablet Take 1 tablet (50 mg total) by mouth daily. Take 25 mg by mouth daily for 7 days, then take 50 mg by mouth daily. 30 tablet 2  . venlafaxine (EFFEXOR) 37.5 MG tablet TAKE 1 tab by mouth daily for one week then increase to 1 tablet twice a day after that (Patient not taking: Reported on 12/26/2017) 60 tablet 0   No current facility-administered medications for this visit.     Allergies: No Known Allergies  Past Surgical History:  Procedure Laterality Date  . NO PAST SURGERIES      Social History   Socioeconomic History  . Marital status: Single    Spouse name: n/a  . Number of children: 1  . Years of education: 12+  . Highest education level: Not on file  Social Needs  . Financial resource strain: Not on file  . Food insecurity - worry: Not on file  . Food insecurity - inability: Not on file  . Transportation needs - medical: Not on file  . Transportation needs - non-medical: Not on file  Occupational History  . Occupation: Training and development officersales associate    Employer: [employer]  Tobacco Use  . Smoking status: Current Some Day Smoker    Packs/day: 0.50    Types: Cigarettes, E-cigarettes    Last attempt to quit: 10/31/2012    Years since quitting: 5.1  . Smokeless tobacco: Never Used  Substance and Sexual Activity  . Alcohol use: Yes    Comment: social-before pregnancy  . Drug use: Yes    Types: Cocaine    Comment: Pt reports last cocaine use was 12/12/2017.  Marland Kitchen. Sexual activity: Yes    Partners: Male    Birth control/protection: None  Other Topics Concern  . Not on  file  Social History Narrative   Lives alone.   Her daughter lives with her father.    Her family lives nearby.    Family History  Problem Relation Age of Onset  . Cancer Maternal Grandfather      ROS Review of Systems See HPI Constitution: No fevers or chills No malaise No diaphoresis Skin: No rash or itching Eyes: no blurry vision, no double vision GU: no dysuria or hematuria Neuro: no dizziness or headaches * all others reviewed and negative   Objective: There were no vitals filed for this visit.  Physical Exam  Assessment and Plan There are no diagnoses linked to this encounter.   Cassandr Cederberg P [employer]

## 2018-01-13 ENCOUNTER — Ambulatory Visit: Payer: Self-pay | Admitting: Family Medicine

## 2018-01-13 NOTE — Progress Notes (Deleted)
  No chief complaint on file.   HPI  4 review of systems  Past Medical History:  Diagnosis Date  . Anemia   . Breastfeeding (infant) 07/16/2013  . Cocaine abuse (HCC)   . Normal delivery 07/16/2013    Current Outpatient Medications  Medication Sig Dispense Refill  . Prenatal-FeFum-FA-DHA w/o A (PRENATAL + DHA) 27-1 & 250 MG THPK Take 1 tablet by mouth every morning. 60 each 11  . sertraline (ZOLOFT) 50 MG tablet Take 1 tablet (50 mg total) by mouth daily. Take 25 mg by mouth daily for 7 days, then take 50 mg by mouth daily. 30 tablet 2  . venlafaxine (EFFEXOR) 37.5 MG tablet TAKE 1 tab by mouth daily for one week then increase to 1 tablet twice a day after that (Patient not taking: Reported on 12/26/2017) 60 tablet 0   No current facility-administered medications for this visit.     Allergies: No Known Allergies  Past Surgical History:  Procedure Laterality Date  . NO PAST SURGERIES      Social History   Socioeconomic History  . Marital status: Single    Spouse name: n/a  . Number of children: 1  . Years of education: 12+  . Highest education level: Not on file  Social Needs  . Financial resource strain: Not on file  . Food insecurity - worry: Not on file  . Food insecurity - inability: Not on file  . Transportation needs - medical: Not on file  . Transportation needs - non-medical: Not on file  Occupational History  . Occupation: Training and development officersales associate    Employer: [employer]  Tobacco Use  . Smoking status: Current Some Day Smoker    Packs/day: 0.50    Types: Cigarettes, E-cigarettes    Last attempt to quit: 10/31/2012    Years since quitting: 5.2  . Smokeless tobacco: Never Used  Substance and Sexual Activity  . Alcohol use: Yes    Comment: social-before pregnancy  . Drug use: Yes    Types: Cocaine    Comment: Pt reports last cocaine use was 12/12/2017.  Marland Kitchen. Sexual activity: Yes    Partners: Male    Birth control/protection: None  Other Topics Concern  . Not on  file  Social History Narrative   Lives alone.   Her daughter lives with her father.    Her family lives nearby.    Family History  Problem Relation Age of Onset  . Cancer Maternal Grandfather      ROS Review of Systems See HPI Constitution: No fevers or chills No malaise No diaphoresis Skin: No rash or itching Eyes: no blurry vision, no double vision GU: no dysuria or hematuria Neuro: no dizziness or headaches * all others reviewed and negative   Objective: There were no vitals filed for this visit.  Physical Exam  Assessment and Plan There are no diagnoses linked to this encounter.   Delores P [employer]

## 2018-01-16 ENCOUNTER — Other Ambulatory Visit (HOSPITAL_COMMUNITY)
Admission: RE | Admit: 2018-01-16 | Discharge: 2018-01-16 | Disposition: A | Discharge status: Home / Self Care | Payer: Medicaid Other | Source: Ambulatory Visit | Attending: Obstetrics and Gynecology | Admitting: Obstetrics and Gynecology

## 2018-01-16 ENCOUNTER — Ambulatory Visit (INDEPENDENT_AMBULATORY_CARE_PROVIDER_SITE_OTHER): Payer: Medicaid Other | Admitting: Obstetrics and Gynecology

## 2018-01-16 ENCOUNTER — Encounter: Payer: Self-pay | Admitting: Obstetrics and Gynecology

## 2018-01-16 VITALS — BP 127/86 | HR 88 | Wt 129.3 lb

## 2018-01-16 DIAGNOSIS — F419 Anxiety disorder, unspecified: Secondary | ICD-10-CM

## 2018-01-16 DIAGNOSIS — F329 Major depressive disorder, single episode, unspecified: Secondary | ICD-10-CM

## 2018-01-16 DIAGNOSIS — O0991 Supervision of high risk pregnancy, unspecified, first trimester: Secondary | ICD-10-CM

## 2018-01-16 DIAGNOSIS — F1910 Other psychoactive substance abuse, uncomplicated: Secondary | ICD-10-CM

## 2018-01-16 NOTE — Addendum Note (Signed)
Addended by: Catalina AntiguaONSTANT, Nyzier Boivin on: 01/16/2018 04:35 PM   Modules accepted: Orders

## 2018-01-16 NOTE — Progress Notes (Addendum)
   PRENATAL VISIT NOTE  Subjective:  Penny Clark is a 27 y.o. G2P1001 at 5763w5d being seen today for ongoing prenatal care.  She is currently monitored for the following issues for this low-risk pregnancy and has Anxiety and depression; Supervision of high risk pregnancy, antepartum, first trimester; and Substance abuse (HCC) on their problem list.  Patient reports no complaints.  Contractions: Irritability. Vag. Bleeding: None.   . Denies leaking of fluid.   The following portions of the patient's history were reviewed and updated as appropriate: allergies, current medications, past family history, past medical history, past social history, past surgical history and problem list. Problem list updated.  Objective:   Vitals:   01/16/18 1525  BP: 127/86  Pulse: 88  Weight: 129 lb 4.8 oz (58.7 kg)    Fetal Status:           General:  Alert, oriented and cooperative. Patient is in no acute distress.  Skin: Skin is warm and dry. No rash noted.   Cardiovascular: Normal heart rate noted  Respiratory: Normal respiratory effort, no problems with respiration noted  Abdomen: Soft, gravid, appropriate for gestational age.  Pain/Pressure: Absent     Pelvic: Cervical exam performed        Extremities: Normal range of motion.  Edema: None  Mental Status:  Normal mood and affect. Normal behavior. Normal judgment and thought content.   Assessment and Plan:  Pregnancy: G2P1001 at 5463w5d  1. Supervision of high risk pregnancy, antepartum, first trimester Initial ob labs ordered Patient very preoccupied by FOB lack of involvement as he does not believe that he is the FOB. Patient requesting medical proof of conception date. Explained to the patient that [facility]. Paternity can be confirmed once the child is born Anatomy ultrasound ordered - Cytology - PAP - Cervicovaginal ancillary only - Culture, OB Urine - Obstetric Panel, Including HIV - GC/Chlamydia probe  amp ([place])not at [facility] - Hemoglobinopathy evaluation - Cystic Fibrosis Mutation 97 - SMN1 COPY NUMBER ANALYSIS (SMA Carrier Screen) - Genetic Screening  2. Anxiety and depression Currently on zoloft  3. Substance abuse (HCC) Declined testing  Preterm labor symptoms and general obstetric precautions including but not limited to vaginal bleeding, contractions, leaking of fluid and fetal movement were reviewed in detail with the patient. Please refer to After Visit Summary for other counseling recommendations.  No Follow-up on file.   Catalina AntiguaPeggy Tyyonna Soucy, MD

## 2018-01-17 LAB — CERVICOVAGINAL ANCILLARY ONLY
Bacterial vaginitis: POSITIVE — AB
Candida vaginitis: NEGATIVE
Chlamydia: NEGATIVE
NEISSERIA GONORRHEA: NEGATIVE
TRICH (WINDOWPATH): NEGATIVE

## 2018-01-17 LAB — CYTOLOGY - PAP: Diagnosis: NEGATIVE

## 2018-01-18 LAB — CULTURE, OB URINE

## 2018-01-18 LAB — URINE CULTURE, OB REFLEX: Organism ID, Bacteria: NO GROWTH

## 2018-01-20 ENCOUNTER — Other Ambulatory Visit: Payer: Self-pay | Admitting: Obstetrics and Gynecology

## 2018-01-20 MED ORDER — METRONIDAZOLE 500 MG PO TABS
500.0000 mg | ORAL_TABLET | Freq: Two times a day (BID) | ORAL | 0 refills | Status: DC
Start: 2018-01-20 — End: 2018-02-14

## 2018-01-21 LAB — OBSTETRIC PANEL, INCLUDING HIV
ANTIBODY SCREEN: NEGATIVE
BASOS: 0 %
Basophils Absolute: 0 10*3/uL (ref 0.0–0.2)
EOS (ABSOLUTE): 0.1 10*3/uL (ref 0.0–0.4)
EOS: 1 %
HEMATOCRIT: 43.3 % (ref 34.0–46.6)
HEMOGLOBIN: 14.5 g/dL (ref 11.1–15.9)
HEP B S AG: NEGATIVE
HIV Screen 4th Generation wRfx: NONREACTIVE
IMMATURE GRANS (ABS): 0 10*3/uL (ref 0.0–0.1)
IMMATURE GRANULOCYTES: 0 %
LYMPHS: 31 %
Lymphocytes Absolute: 3.5 10*3/uL — ABNORMAL HIGH (ref 0.7–3.1)
MCH: 28.8 pg (ref 26.6–33.0)
MCHC: 33.5 g/dL (ref 31.5–35.7)
MCV: 86 fL (ref 79–97)
MONOS ABS: 0.7 10*3/uL (ref 0.1–0.9)
Monocytes: 6 %
NEUTROS PCT: 62 %
Neutrophils Absolute: 6.9 10*3/uL (ref 1.4–7.0)
Platelets: 370 10*3/uL (ref 150–379)
RBC: 5.04 x10E6/uL (ref 3.77–5.28)
RDW: 14.4 % (ref 12.3–15.4)
RPR: NONREACTIVE
RUBELLA: 1.5 {index} (ref >0.99)
Rh Factor: POSITIVE
WBC: 11.3 10*3/uL — ABNORMAL HIGH (ref 3.4–10.8)

## 2018-01-21 LAB — HEMOGLOBINOPATHY EVALUATION
HEMOGLOBIN A2 QUANTITATION: 2.3 % (ref 1.8–3.2)
HGB C: 0 %
HGB S: 0 %
HGB VARIANT: 0 %
Hemoglobin F Quantitation: 0 % (ref 0.0–2.0)
Hgb A: 97.7 % (ref 96.4–98.8)

## 2018-01-22 ENCOUNTER — Other Ambulatory Visit: Payer: Self-pay | Admitting: Physician Assistant

## 2018-01-22 NOTE — Telephone Encounter (Signed)
Copied from CRM [id number]. Topic: Quick Communication - Rx Refill/Question >> Jan 22, 2018  3:28 PM Maia Pettiesrtiz, Kristie S wrote: Medication: Prenatal-FeFum-FA-DHA w/o A (PRENATAL + DHA) 27-1 & 250 MG THPK - [place] and has 1-2 days of prenatal vitamins - she is asking for 1 time fill to be sent to pharmacy for her Has the patient contacted their pharmacy? Yes.   Preferred Pharmacy (with phone number or street name): [id number] [place] [address] [facility] [address] [facility] [phone] (Phone) [phone] (Fax)

## 2018-01-23 MED ORDER — PRENATAL + DHA 27-1 & 250 MG PO THPK: 1.0000 | PACK | Freq: Every morning | ORAL | 11 refills | Status: DC

## 2018-01-23 NOTE — Telephone Encounter (Signed)
Prenatal-FeFum-FA-DHA w/o A refill LR: 12/09/17 LOV: 12/09/17 PCP: Porfirio Oarhelle Jeffery PA [facility] [address] [facility] [address] [facility]

## 2018-01-24 LAB — CYSTIC FIBROSIS MUTATION 97: GENE DIS ANAL CARRIER INTERP BLD/T-IMP: NOT DETECTED

## 2018-01-25 LAB — SMN1 COPY NUMBER ANALYSIS (SMA CARRIER SCREENING)

## 2018-01-27 ENCOUNTER — Institutional Professional Consult (permissible substitution): Payer: Medicaid Other

## 2018-01-27 ENCOUNTER — Encounter: Payer: Self-pay | Admitting: Obstetrics and Gynecology

## 2018-02-06 ENCOUNTER — Encounter: Payer: Self-pay | Admitting: Physician Assistant

## 2018-02-12 ENCOUNTER — Encounter: Payer: Self-pay | Admitting: Physician Assistant

## 2018-02-12 NOTE — BH Specialist Note (Deleted)
Integrated Behavioral Health Initial Visit  MRN: [id number] Name: Penny Clark  Number of Integrated Behavioral Health Clinician visits:: 1/6 Session Start time: ***  Session End time: *** Total time: {IBH Total Time:21014050}  Type of Service: Integrated Behavioral Health- Individual/Family Interpretor:No. Interpretor Name and Language: n/a   Warm Hand Off Completed.       SUBJECTIVE: Penny Clark is a 27 y.o. female accompanied by {CHL AMB ACCOMPANIED ON:[id number]}BY:[phone]} Patient was referred by Dr Jolayne Pantheronstant for depression and anxiety. Patient reports the following symptoms/concerns: *** Duration of problem: ***; Severity of problem: {Mild/Moderate/Severe:20260}  OBJECTIVE: Mood: {BHH MOOD:22306} and Affect: {BHH AFFECT:22307} Risk of harm to self or others: {CHL AMB BH Suicide Current Mental Status:21022748}  LIFE CONTEXT: Family and Social: *** School/Work: *** Self-Care: *** Life Changes: Current pregnancy ***  GOALS ADDRESSED: Patient will: 1. Reduce symptoms of: {IBH Symptoms:21014056} 2. Increase knowledge and/or ability of: {IBH Patient Tools:21014057}  3. Demonstrate ability to: {IBH Goals:21014053}  INTERVENTIONS: Interventions utilized: {IBH Interventions:21014054}  Standardized Assessments completed: {IBH Screening Tools:21014051}  ASSESSMENT: Patient currently experiencing ***.   Patient may benefit from psychoeducation and brief therapeutic interventions regarding coping with symptoms of ***   PLAN: 1. Follow up with behavioral health clinician on : *** 2. Behavioral recommendations:  -*** -*** -Read educational materials regarding coping with symptoms of ***  3. Referral(s): {IBH Referrals:21014055} 4. "From scale of 1-10, how likely are you to follow plan?": ***  Rae LipsJamie C McMannes, LCSW  Depression screen [facility] 2/9 12/09/2017 08/13/2017 04/24/2017 04/17/2017  Decreased Interest 3 3 3 3   Down, Depressed, Hopeless 3 3 3 3   PHQ - 2 Score 6 6 6 6   Altered  sleeping 3 3 3 3   Tired, decreased energy 3 3 3 2   Change in appetite 3 3 3 3   Feeling bad or failure about yourself  3 3 3 3   Trouble concentrating 3 - 3 3  Moving slowly or fidgety/restless 3 3 3 3   Suicidal thoughts 0 0 3 0  PHQ-9 Score 24 21 27 23   Difficult doing work/chores Extremely dIfficult Extremely dIfficult - -   No flowsheet data found.

## 2018-02-13 ENCOUNTER — Institutional Professional Consult (permissible substitution): Payer: Medicaid Other

## 2018-02-13 ENCOUNTER — Encounter: Payer: Medicaid Other | Admitting: Obstetrics and Gynecology

## 2018-02-14 ENCOUNTER — Encounter: Payer: Self-pay | Admitting: Advanced Practice Midwife

## 2018-02-14 ENCOUNTER — Ambulatory Visit (INDEPENDENT_AMBULATORY_CARE_PROVIDER_SITE_OTHER): Payer: Medicaid Other | Admitting: Advanced Practice Midwife

## 2018-02-14 DIAGNOSIS — O0991 Supervision of high risk pregnancy, unspecified, first trimester: Secondary | ICD-10-CM

## 2018-02-14 DIAGNOSIS — F1910 Other psychoactive substance abuse, uncomplicated: Secondary | ICD-10-CM

## 2018-02-14 NOTE — Progress Notes (Signed)
   PRENATAL VISIT NOTE  Subjective:  Penny Clark is a 27 y.o. G2P1001 at 397w6d being seen today for ongoing prenatal care.  She is currently monitored for the following issues for this high-risk pregnancy and has Anxiety and depression; Supervision of high risk pregnancy, antepartum, first trimester; and Substance abuse (HCC) on their problem list.  Patient reports no complaints.  Contractions: Not present. Vag. Bleeding: None.   . Denies leaking of fluid.   The following portions of the patient's history were reviewed and updated as appropriate: allergies, current medications, past family history, past medical history, past social history, past surgical history and problem list. Problem list updated.  Objective:   Vitals:   02/14/18 1001  BP: 130/87  Pulse: (!) 122  Temp: 98.5 F (36.9 C)  Weight: 132 lb 6.4 oz (60.1 kg)    Fetal Status: Fetal Heart Rate (bpm): 144 Fundal Height: 15 cm       General:  Alert, oriented and cooperative. Patient is in no acute distress.  Skin: Skin is warm and dry. No rash noted.   Cardiovascular: Normal heart rate noted  Respiratory: Normal respiratory effort, no problems with respiration noted  Abdomen: Soft, gravid, appropriate for gestational age.  Pain/Pressure: Absent     Pelvic: Cervical exam deferred        Extremities: Normal range of motion.  Edema: None  Mental Status: Normal mood and affect. Normal behavior. Normal judgment and thought content.   Assessment and Plan:  Pregnancy: G2P1001 at 367w6d  1. Substance abuse (HCC)  2. Anxiety and depression - Feels stable on current   3. Supervision of high risk pregnancy, antepartum, first trimester - Anatomy US 5/13  Preterm labor symptoms and general obstetric precautions including but not limited to vaginal bleeding, contractions, leaking of fluid and fetal movement were reviewed in detail with the patient. Please refer to After Visit Summary for other counseling recommendations.    Return in about 1 month (around 03/14/2018) for ROB.  Future Appointments  Date Time Provider Department Center  03/17/2018  2:30 PM [facility] US 1 [facility]    Dorathy KinsmanVirginia Giannina Bartolome, [place]

## 2018-02-14 NOTE — Progress Notes (Deleted)
   PRENATAL VISIT NOTE  Subjective:  Penny Clark is a 27 y.o. G2P1001 at 374w6d being seen today for ongoing prenatal care.  She is currently monitored for the following issues for this {Blank single:19197::"high-risk","low-risk"} pregnancy and has Anxiety and depression; Supervision of high risk pregnancy, antepartum, first trimester; and Substance abuse (HCC) on their problem list.  Patient reports {sx:14538}.  Contractions: Not present. Vag. Bleeding: None.   . Denies leaking of fluid.   The following portions of the patient's history were reviewed and updated as appropriate: allergies, current medications, past family history, past medical history, past social history, past surgical history and problem list. Problem list updated.  Objective:   Vitals:   02/14/18 1001  BP: 130/87  Pulse: (!) 122  Temp: 98.5 F (36.9 C)  Weight: 132 lb 6.4 oz (60.1 kg)    Fetal Status:           General:  Alert, oriented and cooperative. Patient is in no acute distress.  Skin: Skin is warm and dry. No rash noted.   Cardiovascular: Normal heart rate noted  Respiratory: Normal respiratory effort, no problems with respiration noted  Abdomen: Soft, gravid, appropriate for gestational age.  Pain/Pressure: Absent     Pelvic: {Blank single:19197::"Cervical exam performed","Cervical exam deferred"}        Extremities: Normal range of motion.  Edema: None  Mental Status: Normal mood and affect. Normal behavior. Normal judgment and thought content.   Assessment and Plan:  Pregnancy: G2P1001 at 464w6d  1. Substance abuse (HCC) ***  2. Supervision of high risk pregnancy, antepartum, first trimester ***  {Blank single:19197::"Term","Preterm"} labor symptoms and general obstetric precautions including but not limited to vaginal bleeding, contractions, leaking of fluid and fetal movement were reviewed in detail with the patient. Please refer to After Visit Summary for other counseling recommendations.    No follow-ups on file.  Future Appointments  Date Time Provider Department Center  03/17/2018  2:30 PM [facility] US 1 [facility]-US    Dorathy KinsmanVirginia Elon Eoff, [place]

## 2018-02-14 NOTE — Patient Instructions (Signed)
Second Trimester of Pregnancy The second trimester is from week 13 through week 28, month 4 through 6. This is often the time in pregnancy that you feel your best. Often times, morning sickness has lessened or quit. You may have more energy, and you may get hungry more often. Your unborn baby (fetus) is growing rapidly. At the end of the sixth month, he or she is about 9 inches long and weighs about 1 pounds. You will likely feel the baby move (quickening) between 18 and 20 weeks of pregnancy. Follow these instructions at home:  Avoid all smoking, herbs, and alcohol. Avoid drugs not approved by your doctor.  Do not use any tobacco products, including cigarettes, chewing tobacco, and electronic cigarettes. If you need help quitting, ask your doctor. You may get counseling or other support to help you quit.  Only take medicine as told by your doctor. Some medicines are safe and some are not during pregnancy.  Exercise only as told by your doctor. Stop exercising if you start having cramps.  Eat regular, healthy meals.  Wear a good support bra if your breasts are tender.  Do not use hot tubs, steam rooms, or saunas.  Wear your seat belt when driving.  Avoid raw meat, uncooked cheese, and liter boxes and soil used by cats.  Take your prenatal vitamins.  Take 1500-2000 milligrams of calcium daily starting at the 20th week of pregnancy until you deliver your baby.  Try taking medicine that helps you poop (stool softener) as needed, and if your doctor approves. Eat more fiber by eating fresh fruit, vegetables, and whole grains. Drink enough fluids to keep your pee (urine) clear or pale yellow.  Take warm water baths (sitz baths) to soothe pain or discomfort caused by hemorrhoids. Use hemorrhoid cream if your doctor approves.  If you have puffy, bulging veins (varicose veins), wear support hose. Raise (elevate) your feet for 15 minutes, 3-4 times a day. Limit salt in your diet.  Avoid heavy  lifting, wear low heals, and sit up straight.  Rest with your legs raised if you have leg cramps or low back pain.  Visit your dentist if you have not gone during your pregnancy. Use a soft toothbrush to brush your teeth. Be gentle when you floss.  You can have sex (intercourse) unless your doctor tells you not to.  Go to your doctor visits. Get help if:  You feel dizzy.  You have mild cramps or pressure in your lower belly (abdomen).  You have a nagging pain in your belly area.  You continue to feel sick to your stomach (nauseous), throw up (vomit), or have watery poop (diarrhea).  You have bad smelling fluid coming from your vagina.  You have pain with peeing (urination). Get help right away if:  You have a fever.  You are leaking fluid from your vagina.  You have spotting or bleeding from your vagina.  You have severe belly cramping or pain.  You lose or gain weight rapidly.  You have trouble catching your breath and have chest pain.  You notice sudden or extreme puffiness (swelling) of your face, hands, ankles, feet, or legs.  You have not felt the baby move in over an hour.  You have severe headaches that do not go away with medicine.  You have vision changes. This information is not intended to replace advice given to you by your health care provider. Make sure you discuss any questions you have with your health care   provider. Document Released: 01/16/2010 Document Revised: 03/29/2016 Document Reviewed: 12/23/2012 [employer] Interactive Patient Education  2017 [employer]  

## 2018-02-17 ENCOUNTER — Institutional Professional Consult (permissible substitution): Payer: Medicaid Other

## 2018-02-24 ENCOUNTER — Other Ambulatory Visit (HOSPITAL_COMMUNITY)
Admission: RE | Admit: 2018-02-24 | Discharge: 2018-02-24 | Disposition: A | Discharge status: Home / Self Care | Payer: Medicaid Other | Source: Ambulatory Visit | Attending: Certified Nurse Midwife | Admitting: Certified Nurse Midwife

## 2018-02-24 ENCOUNTER — Ambulatory Visit (INDEPENDENT_AMBULATORY_CARE_PROVIDER_SITE_OTHER): Payer: Medicaid Other | Admitting: Certified Nurse Midwife

## 2018-02-24 ENCOUNTER — Encounter: Payer: Self-pay | Admitting: Certified Nurse Midwife

## 2018-02-24 ENCOUNTER — Encounter: Payer: Medicaid Other | Admitting: Certified Nurse Midwife

## 2018-02-24 VITALS — BP 128/87 | HR 102 | Wt 130.2 lb

## 2018-02-24 DIAGNOSIS — O26892 Other specified pregnancy related conditions, second trimester: Secondary | ICD-10-CM

## 2018-02-24 DIAGNOSIS — O23592 Infection of other part of genital tract in pregnancy, second trimester: Secondary | ICD-10-CM | POA: Insufficient documentation

## 2018-02-24 DIAGNOSIS — B373 Candidiasis of vulva and vagina: Secondary | ICD-10-CM | POA: Diagnosis not present

## 2018-02-24 DIAGNOSIS — Z720 Tobacco use: Secondary | ICD-10-CM

## 2018-02-24 DIAGNOSIS — F329 Major depressive disorder, single episode, unspecified: Secondary | ICD-10-CM

## 2018-02-24 DIAGNOSIS — F1910 Other psychoactive substance abuse, uncomplicated: Secondary | ICD-10-CM

## 2018-02-24 DIAGNOSIS — O0991 Supervision of high risk pregnancy, unspecified, first trimester: Secondary | ICD-10-CM

## 2018-02-24 DIAGNOSIS — N898 Other specified noninflammatory disorders of vagina: Secondary | ICD-10-CM

## 2018-02-24 DIAGNOSIS — F419 Anxiety disorder, unspecified: Secondary | ICD-10-CM

## 2018-02-24 DIAGNOSIS — F32A Depression, unspecified: Secondary | ICD-10-CM

## 2018-02-24 DIAGNOSIS — F17200 Nicotine dependence, unspecified, uncomplicated: Secondary | ICD-10-CM

## 2018-02-24 DIAGNOSIS — B3731 Acute candidiasis of vulva and vagina: Secondary | ICD-10-CM

## 2018-02-24 MED ORDER — TERCONAZOLE 0.8 % VA CREA: 1.0000 | TOPICAL_CREAM | Freq: Every day | VAGINAL | 0 refills | Status: DC

## 2018-02-24 MED ORDER — FLUCONAZOLE 150 MG PO TABS: 150.0000 mg | ORAL_TABLET | Freq: Once | ORAL | 0 refills | Status: AC

## 2018-02-24 MED ORDER — BUPROPION HCL ER (XL) 150 MG PO TB24: 150.0000 mg | ORAL_TABLET | Freq: Every day | ORAL | 12 refills | Status: DC

## 2018-02-24 NOTE — Patient Instructions (Addendum)
Substance Use Disorder Substance use disorder happens when a person's repeated use of drugs or alcohol interferes with his or her ability to be productive. This disorder can cause problems with your mental and physical health. It can affect your ability to have healthy relationships, and it can keep you from being able to meet your responsibilities at work, home, or school. It can also lead to addiction. The most commonly abused substances include:  Alcohol.  Tobacco.  Marijuana.  Stimulants, such as cocaine and methamphetamine.  Hallucinogens, such as LSD and PCP.  Opioids, such as some prescription pain medicines and heroin.  What are the causes? This condition may develop due to social, psychological, or physical reasons. Causes include:  Stress.  Abuse.  Peer pressure.  Anxiety.  Depression.  What increases the risk? This condition is more likely to develop in people who:  Are stressed.  Have been abused.  Have a mental health disorder, such as depression.  Are born with certain genes.  What are the signs or symptoms? Symptoms of this condition include:  Using the substance for longer periods of time or at a higher dosage than what is normal or intended.  Having a lasting desire to use the substance.  Being unable to slow down or stop your use of the substance.  Spending an abnormal amount of time seeking the substance, using the substance, or recovering from using the substance.  Craving the substance.  Substance use that: ? Interferes with your work, school, or home life. ? Interferes with your personal and social relationships. ? Makes you give up activities that you once enjoyed or found important.  Using the substance even though: ? You know it is dangerous or bad for your health. ? You know it is causing problems in your life.  Needing more and more of the substance to get the same effect (developing tolerance).  Experiencing physical symptoms  if you do not use the substance (withdrawal).  Using the substance to avoid withdrawal.  How is this diagnosed? This condition may be diagnosed based on:  Your history of substance use.  The way in which substance abuse affects your life.  Having at least two symptoms of substance use disorder within a 87-month period.  Your health care provider may also test your blood and urine for alcohol and drugs. How is this treated? This condition may be treated by:  Stopping substance use safely. This may require taking medicines and being closely observed for several days.  Taking part in group and individual counseling from mental health providers who help people with substance use disorder.  Staying at a residential treatment center for several days or weeks.  Attending daily counseling sessions at a treatment center.  Taking medicine as told by your health care provider: ? To ease symptoms and prevent complications during withdrawal. ? To treat other mental health issues, such as depression or anxiety. ? To block cravings by causing the same effects as the substance. ? To block the effects of the substance or replace good sensations with unpleasant ones.  Going to a support group to share your experience with others who are going through the same thing. These groups are an important part of long-term recovery for many people. They include 12-step programs like Alcoholics Anonymous and Narcotics Anonymous.  Recovery can be a long process. Many people who undergo treatment start using the substance again after stopping. This is called a relapse. If you have a relapse, that does not mean  that treatment will not work. Follow these instructions at home:  Take over-the-counter and prescription medicines only as told by your health care provider.  Do not use any drugs or alcohol.  Keep all follow-up visits as told by your health care provider. This is important. This includes continuing  to work with therapists, health care providers, and support groups. Contact a health care provider if:  You cannot take your medicines as told.  Your symptoms get worse.  You have trouble resisting the urge to use drugs or alcohol. Get help right away if:  You have serious thoughts about hurting yourself or someone else.  You have a relapse. This information is not intended to replace advice given to you by your health care provider. Make sure you discuss any questions you have with your health care provider. Document Released: 06/13/2005 Document Revised: 06/20/2016 Document Reviewed: 10/27/2014 Elsevier Interactive Patient Education  2018 [employer].  Recovering From Addiction Addiction is a complex disease of the brain. It causes an uncontrollable (compulsive) need for a substance. You can be addicted to substances including alcohol, tobacco, illegal drugs, or prescription medicines such as painkillers. Addiction can change the way that your brain works. It affects memory, behavior, and how you make decisions. Without treatment, addiction can get worse. However, with treatment and lifestyle changes, you can recover from addiction. What types of treatment are available? The treatment program that is right for you will depend on many factors, including the type of addiction that you have. Treatment programs can be outpatient or inpatient. In an outpatient program, you live at home and go to work or school, but you also go to a clinic for treatment. With an inpatient program, you live and sleep at the program facility during treatment. Other treatment options include:  Medicine. ? Some addictions may be treated with prescription medicines. ? You may also need medicine to treat other mental health conditions such as anxiety or depression.  Counseling and behavior therapy. Therapy can help you learn new ways to respond to situations that are stressful or that tempt you to use the  addictive substance.  Support groups. These include therapy groups and 12-step programs. These can help individuals and families during treatment and recovery. Examples of 12-step programs are Alcoholics Anonymous (AA) and Narcotics Anonymous (NA).  How to manage lifestyle changes Managing stress Too much stress can lead to returning to the addiction (having a relapse). You need to find effective ways to manage your stress. Some techniques to cope with stress include:  Meditation, yoga, or deep breathing.  Exercise. Create an exercise routine that you enjoy and that allows you to work off some energy.  Creating or listening to music.  Muscle relaxation exercises.  Medicines Some medicines may make you feel calmer and help you have fewer cravings. If your health care provider prescribes medicines, make sure you:  Avoid using alcohol and other substances that may prevent your medicines from working properly (may interact).  Talk with your pharmacist or health care provider about all medicines that you take, the possible problems (side effects) that they can cause, and which medicines are safe to take together.  Make it your goal to take part in all treatment decisions (shared decision-making). Ask about possible side effects of medicines that your health care provider recommends, and tell him or her how you feel about having those side effects. It is best if shared decision-making with your health care provider is part of your total treatment plan.  Relationships Supportive relationships are very important in your recovery. When you are recovering from drug addiction, it will be important to avoid being around people who use drugs. For many people, this means developing new and different relationships. Some ways to do this include:  Developing trusting relationships with the people you meet in treatment or in AA or NA. These people share your desire to stop using substances (get sober) and  to stay sober.  Getting a sponsor as a primary support person if you are attending a 12-step program.  Building relationships with people you meet through activities such as hobbies, volunteering, or exercising.  How to recognize changes in your condition When recovering from an addiction, it is very common for a person to relapse and start using the substance again. Contact your sponsor, therapist, or health care provider to seek additional help if you experience the following:  Anxiety.  Excessive anger.  Isolating yourself from others.  Trouble sleeping.  Feeling depressed.  Loss of appetite.  Fantasies about using the substance.  Where to find support Talking to others  You may be advised to see a family therapist along with members of your family. Family therapy can help you and your family understand what led you to addiction. Talk with your family about this approach.  Let your family members or friends know that they can help you through treatment. Support from loved ones will be important to help you maintain positive changes. Financial resources Be sure to check with your insurance carrier to find out what treatment options are covered by your plan. You may also be able to find financial assistance through not-for-profit organizations or with local government-based resources. If you are taking medicines, you may be able to get the generic form, which may be less expensive than brand-name medicine. Some makers of prescription medicines also offer help to patients who cannot afford the medicines that they need. Follow these instructions at home:  Take over-the-counter and prescription medicines only as told by your health care provider.  Stay in treatment until you complete the program. Take an active role in your treatment and your physical and emotional self-care. Develop a follow-up plan.  Keep all follow-up visits as told by your health care provider and counselor.  This is important.  Eat a healthy diet, exercise regularly, and get enough sleep. Questions to ask your health care provider  If you are taking medicines: ? How long do I need to take medicine? ? Are there any long-term side effects of my medicine? ? Are there other options instead of taking medicine?  Would I benefit from therapy?  How often should I follow up with a health care provider? Contact a health care provider if:  You feel like you might relapse.  You have stopped taking your medicine. Get help right away if:  You have serious thoughts about hurting yourself or others. If you ever feel like you may hurt yourself or others, or have thoughts about taking your own life, get help right away. You can go to your nearest emergency department or call:  Your local emergency services (911 in the [place]  A suicide crisis helpline, such as the National Suicide Prevention Lifeline at 5101476074. This is open 24 hours a day.  Summary  With treatment and lifestyle changes, it is possible to recover from an addiction to substances like alcohol, tobacco, illegal drugs, or prescription medicines such as painkillers.  Find effective ways to manage your stress to avoid a  relapse. Some techniques to cope with stress include exercise, meditation, yoga, and deep breathing.  Let loved ones know that their support is important to help you recover.  If you have any signs that you may relapse, contact your 12-step sponsor, therapist, or health care provider to seek additional help. This information is not intended to replace advice given to you by your health care provider. Make sure you discuss any questions you have with your health care provider. Document Released: 03/08/2017 Document Revised: 03/08/2017 Document Reviewed: 03/08/2017 Elsevier Interactive Patient Education  2018 [employer].  Second Trimester of Pregnancy The second trimester is from week 14 through week 27 (months 4  through 6). The second trimester is often a time when you feel your best. Your body has adjusted to being pregnant, and you begin to feel better physically. Usually, morning sickness has lessened or quit completely, you may have more energy, and you may have an increase in appetite. The second trimester is also a time when the fetus is growing rapidly. At the end of the sixth month, the fetus is about 9 inches long and weighs about 1 pounds. You will likely begin to feel the baby move (quickening) between 16 and 20 weeks of pregnancy. Body changes during your second trimester Your body continues to go through many changes during your second trimester. The changes vary from woman to woman.  Your weight will continue to increase. You will notice your lower abdomen bulging out.  You may begin to get stretch marks on your hips, abdomen, and breasts.  You may develop headaches that can be relieved by medicines. The medicines should be approved by your health care provider.  You may urinate more often because the fetus is pressing on your bladder.  You may develop or continue to have heartburn as a result of your pregnancy.  You may develop constipation because certain hormones are causing the muscles that push waste through your intestines to slow down.  You may develop hemorrhoids or swollen, bulging veins (varicose veins).  You may have back pain. This is caused by: ? Weight gain. ? Pregnancy hormones that are relaxing the joints in your pelvis. ? A shift in weight and the muscles that support your balance.  Your breasts will continue to grow and they will continue to become tender.  Your gums may bleed and may be sensitive to brushing and flossing.  Dark spots or blotches (chloasma, mask of pregnancy) may develop on your face. This will likely fade after the baby is born.  A dark line from your belly button to the pubic area (linea nigra) may appear. This will likely fade after the baby  is born.  You may have changes in your hair. These can include thickening of your hair, rapid growth, and changes in texture. Some women also have hair loss during or after pregnancy, or hair that feels dry or thin. Your hair will most likely return to normal after your baby is born.  What to expect at prenatal visits During a routine prenatal visit:  You will be weighed to make sure you and the fetus are growing normally.  Your blood pressure will be taken.  Your abdomen will be measured to track your baby's growth.  The fetal heartbeat will be listened to.  Any test results from the previous visit will be discussed.  Your health care provider may ask you:  How you are feeling.  If you are feeling the baby move.  If you  have had any abnormal symptoms, such as leaking fluid, bleeding, severe headaches, or abdominal cramping.  If you are using any tobacco products, including cigarettes, chewing tobacco, and electronic cigarettes.  If you have any questions.  Other tests that may be performed during your second trimester include:  Blood tests that check for: ? Low iron levels (anemia). ? High blood sugar that affects pregnant women (gestational diabetes) between 68 and 28 weeks. ? Rh antibodies. This is to check for a protein on red blood cells (Rh factor).  Urine tests to check for infections, diabetes, or protein in the urine.  An ultrasound to confirm the proper growth and development of the baby.  An amniocentesis to check for possible genetic problems.  Fetal screens for spina bifida and Down syndrome.  HIV (human immunodeficiency virus) testing. Routine prenatal testing includes screening for HIV, unless you choose not to have this test.  Follow these instructions at home: Medicines  Follow your health care provider's instructions regarding medicine use. Specific medicines may be either safe or unsafe to take during pregnancy.  Take a prenatal vitamin that  contains at least 600 micrograms (mcg) of folic acid.  If you develop constipation, try taking a stool softener if your health care provider approves. Eating and drinking  Eat a balanced diet that includes fresh fruits and vegetables, whole grains, good sources of protein such as meat, eggs, or tofu, and low-fat dairy. Your health care provider will help you determine the amount of weight gain that is right for you.  Avoid raw meat and uncooked cheese. These carry germs that can cause birth defects in the baby.  If you have low calcium intake from food, talk to your health care provider about whether you should take a daily calcium supplement.  Limit foods that are high in fat and processed sugars, such as fried and sweet foods.  To prevent constipation: ? Drink enough fluid to keep your urine clear or pale yellow. ? Eat foods that are high in fiber, such as fresh fruits and vegetables, whole grains, and beans. Activity  Exercise only as directed by your health care provider. Most women can continue their usual exercise routine during pregnancy. Try to exercise for 30 minutes at least 5 days a week. Stop exercising if you experience uterine contractions.  Avoid heavy lifting, wear low heel shoes, and practice good posture.  A sexual relationship may be continued unless your health care provider directs you otherwise. Relieving pain and discomfort  Wear a good support bra to prevent discomfort from breast tenderness.  Take warm sitz baths to soothe any pain or discomfort caused by hemorrhoids. Use hemorrhoid cream if your health care provider approves.  Rest with your legs elevated if you have leg cramps or low back pain.  If you develop varicose veins, wear support hose. Elevate your feet for 15 minutes, 3-4 times a day. Limit salt in your diet. Prenatal Care  Write down your questions. Take them to your prenatal visits.  Keep all your prenatal visits as told by your health care  provider. This is important. Safety  Wear your seat belt at all times when driving.  Make a list of emergency phone numbers, including numbers for family, friends, the hospital, and police and fire departments. General instructions  Ask your health care provider for a referral to a local prenatal education class. Begin classes no later than the beginning of month 6 of your pregnancy.  Ask for help if you have counseling  or nutritional needs during pregnancy. Your health care provider can offer advice or refer you to specialists for help with various needs.  Do not use hot tubs, steam rooms, or saunas.  Do not douche or use tampons or scented sanitary pads.  Do not cross your legs for long periods of time.  Avoid cat litter boxes and soil used by cats. These carry germs that can cause birth defects in the baby and possibly loss of the fetus by miscarriage or stillbirth.  Avoid all smoking, herbs, alcohol, and unprescribed drugs. Chemicals in these products can affect the formation and growth of the baby.  Do not use any products that contain nicotine or tobacco, such as cigarettes and e-cigarettes. If you need help quitting, ask your health care provider.  Visit your dentist if you have not gone yet during your pregnancy. Use a soft toothbrush to brush your teeth and be gentle when you floss. Contact a health care provider if:  You have dizziness.  You have mild pelvic cramps, pelvic pressure, or nagging pain in the abdominal area.  You have persistent nausea, vomiting, or diarrhea.  You have a bad smelling vaginal discharge.  You have pain when you urinate. Get help right away if:  You have a fever.  You are leaking fluid from your vagina.  You have spotting or bleeding from your vagina.  You have severe abdominal cramping or pain.  You have rapid weight gain or weight loss.  You have shortness of breath with chest pain.  You notice sudden or extreme swelling of your  face, hands, ankles, feet, or legs.  You have not felt your baby move in over an hour.  You have severe headaches that do not go away when you take medicine.  You have vision changes. Summary  The second trimester is from week 14 through week 27 (months 4 through 6). It is also a time when the fetus is growing rapidly.  Your body goes through many changes during pregnancy. The changes vary from woman to woman.  Avoid all smoking, herbs, alcohol, and unprescribed drugs. These chemicals affect the formation and growth your baby.  Do not use any tobacco products, such as cigarettes, chewing tobacco, and e-cigarettes. If you need help quitting, ask your health care provider.  Contact your health care provider if you have any questions. Keep all prenatal visits as told by your health care provider. This is important. This information is not intended to replace advice given to you by your health care provider. Make sure you discuss any questions you have with your health care provider. Document Released: 10/16/2001 Document Revised: 11/27/2016 Document Reviewed: 11/27/2016 Elsevier Interactive Patient Education  2018 [employer].  Second Trimester of Pregnancy The second trimester is from week 14 through week 27 (months 4 through 6). The second trimester is often a time when you feel your best. Your body has adjusted to being pregnant, and you begin to feel better physically. Usually, morning sickness has lessened or quit completely, you may have more energy, and you may have an increase in appetite. The second trimester is also a time when the fetus is growing rapidly. At the end of the sixth month, the fetus is about 9 inches long and weighs about 1 pounds. You will likely begin to feel the baby move (quickening) between 16 and 20 weeks of pregnancy. Body changes during your second trimester Your body continues to go through many changes during your second trimester. The changes vary  from  woman to woman.  Your weight will continue to increase. You will notice your lower abdomen bulging out.  You may begin to get stretch marks on your hips, abdomen, and breasts.  You may develop headaches that can be relieved by medicines. The medicines should be approved by your health care provider.  You may urinate more often because the fetus is pressing on your bladder.  You may develop or continue to have heartburn as a result of your pregnancy.  You may develop constipation because certain hormones are causing the muscles that push waste through your intestines to slow down.  You may develop hemorrhoids or swollen, bulging veins (varicose veins).  You may have back pain. This is caused by: ? Weight gain. ? Pregnancy hormones that are relaxing the joints in your pelvis. ? A shift in weight and the muscles that support your balance.  Your breasts will continue to grow and they will continue to become tender.  Your gums may bleed and may be sensitive to brushing and flossing.  Dark spots or blotches (chloasma, mask of pregnancy) may develop on your face. This will likely fade after the baby is born.  A dark line from your belly button to the pubic area (linea nigra) may appear. This will likely fade after the baby is born.  You may have changes in your hair. These can include thickening of your hair, rapid growth, and changes in texture. Some women also have hair loss during or after pregnancy, or hair that feels dry or thin. Your hair will most likely return to normal after your baby is born.  What to expect at prenatal visits During a routine prenatal visit:  You will be weighed to make sure you and the fetus are growing normally.  Your blood pressure will be taken.  Your abdomen will be measured to track your baby's growth.  The fetal heartbeat will be listened to.  Any test results from the previous visit will be discussed.  Your health care provider may ask  you:  How you are feeling.  If you are feeling the baby move.  If you have had any abnormal symptoms, such as leaking fluid, bleeding, severe headaches, or abdominal cramping.  If you are using any tobacco products, including cigarettes, chewing tobacco, and electronic cigarettes.  If you have any questions.  Other tests that may be performed during your second trimester include:  Blood tests that check for: ? Low iron levels (anemia). ? High blood sugar that affects pregnant women (gestational diabetes) between 8024 and 28 weeks. ? Rh antibodies. This is to check for a protein on red blood cells (Rh factor).  Urine tests to check for infections, diabetes, or protein in the urine.  An ultrasound to confirm the proper growth and development of the baby.  An amniocentesis to check for possible genetic problems.  Fetal screens for spina bifida and Down syndrome.  HIV (human immunodeficiency virus) testing. Routine prenatal testing includes screening for HIV, unless you choose not to have this test.  Follow these instructions at home: Medicines  Follow your health care provider's instructions regarding medicine use. Specific medicines may be either safe or unsafe to take during pregnancy.  Take a prenatal vitamin that contains at least 600 micrograms (mcg) of folic acid.  If you develop constipation, try taking a stool softener if your health care provider approves. Eating and drinking  Eat a balanced diet that includes fresh fruits and vegetables, whole grains, good sources  of protein such as meat, eggs, or tofu, and low-fat dairy. Your health care provider will help you determine the amount of weight gain that is right for you.  Avoid raw meat and uncooked cheese. These carry germs that can cause birth defects in the baby.  If you have low calcium intake from food, talk to your health care provider about whether you should take a daily calcium supplement.  Limit foods that  are high in fat and processed sugars, such as fried and sweet foods.  To prevent constipation: ? Drink enough fluid to keep your urine clear or pale yellow. ? Eat foods that are high in fiber, such as fresh fruits and vegetables, whole grains, and beans. Activity  Exercise only as directed by your health care provider. Most women can continue their usual exercise routine during pregnancy. Try to exercise for 30 minutes at least 5 days a week. Stop exercising if you experience uterine contractions.  Avoid heavy lifting, wear low heel shoes, and practice good posture.  A sexual relationship may be continued unless your health care provider directs you otherwise. Relieving pain and discomfort  Wear a good support bra to prevent discomfort from breast tenderness.  Take warm sitz baths to soothe any pain or discomfort caused by hemorrhoids. Use hemorrhoid cream if your health care provider approves.  Rest with your legs elevated if you have leg cramps or low back pain.  If you develop varicose veins, wear support hose. Elevate your feet for 15 minutes, 3-4 times a day. Limit salt in your diet. Prenatal Care  Write down your questions. Take them to your prenatal visits.  Keep all your prenatal visits as told by your health care provider. This is important. Safety  Wear your seat belt at all times when driving.  Make a list of emergency phone numbers, including numbers for family, friends, the hospital, and police and fire departments. General instructions  Ask your health care provider for a referral to a local prenatal education class. Begin classes no later than the beginning of month 6 of your pregnancy.  Ask for help if you have counseling or nutritional needs during pregnancy. Your health care provider can offer advice or refer you to specialists for help with various needs.  Do not use hot tubs, steam rooms, or saunas.  Do not douche or use tampons or scented sanitary  pads.  Do not cross your legs for long periods of time.  Avoid cat litter boxes and soil used by cats. These carry germs that can cause birth defects in the baby and possibly loss of the fetus by miscarriage or stillbirth.  Avoid all smoking, herbs, alcohol, and unprescribed drugs. Chemicals in these products can affect the formation and growth of the baby.  Do not use any products that contain nicotine or tobacco, such as cigarettes and e-cigarettes. If you need help quitting, ask your health care provider.  Visit your dentist if you have not gone yet during your pregnancy. Use a soft toothbrush to brush your teeth and be gentle when you floss. Contact a health care provider if:  You have dizziness.  You have mild pelvic cramps, pelvic pressure, or nagging pain in the abdominal area.  You have persistent nausea, vomiting, or diarrhea.  You have a bad smelling vaginal discharge.  You have pain when you urinate. Get help right away if:  You have a fever.  You are leaking fluid from your vagina.  You have spotting or bleeding from  your vagina.  You have severe abdominal cramping or pain.  You have rapid weight gain or weight loss.  You have shortness of breath with chest pain.  You notice sudden or extreme swelling of your face, hands, ankles, feet, or legs.  You have not felt your baby move in over an hour.  You have severe headaches that do not go away when you take medicine.  You have vision changes. Summary  The second trimester is from week 14 through week 27 (months 4 through 6). It is also a time when the fetus is growing rapidly.  Your body goes through many changes during pregnancy. The changes vary from woman to woman.  Avoid all smoking, herbs, alcohol, and unprescribed drugs. These chemicals affect the formation and growth your baby.  Do not use any tobacco products, such as cigarettes, chewing tobacco, and e-cigarettes. If you need help quitting, ask your  health care provider.  Contact your health care provider if you have any questions. Keep all prenatal visits as told by your health care provider. This is important. This information is not intended to replace advice given to you by your health care provider. Make sure you discuss any questions you have with your health care provider. Document Released: 10/16/2001 Document Revised: 11/27/2016 Document Reviewed: 11/27/2016 Elsevier Interactive Patient Education  2018 [employer].   AREA PEDIATRIC/FAMILY PRACTICE PHYSICIANS  [facility] [address] [place], [place]  [address] Phone - [phone]   Fax - [phone]  [facility] [address] [place], [place] [address] Phone - [phone]   Fax - [phone]  JACK AMOS [address] [place], [place]  [address] Phone - [phone]   Fax - [phone]  [facility] [address] [place], [place]  [address] Phone - [phone]   Fax - [phone]  [facility] [address] [place], [place]  [address] Phone - [phone]   Fax - [phone]  [facility] [address] [place], [place]  [address] Phone - [phone]   Fax - [phone]  [facility] [address] [place], [place]  [address] Phone - [phone]   Fax - [phone]  [facility] [address] [place], [address] [place], [place]  [address] Phone - [phone]   Fax - [phone]  [facility] AT [facility] [address] [place], [place]  [address] Phone - [phone]   Fax - [phone] [facility] AT [place] [address] [place], [place]  [address] Phone - [phone]   Fax - [phone]  [facility] [address] [place], [place]  [address] Phone - [phone]   Fax - [phone]  [facility]  [facility] [address], [address] [place], [place]  [address] Phone - [phone]   Fax - [phone]  [facility] [place] [address] [place], [place]  [address] Phone - [phone]   Fax - [phone]  [facility] [address] [place], [place]  [address] Phone - [phone]  [facility] [address] [place], [place]  [address] Phone - [phone]   Fax - [phone]  [facility] [address] [place], [place]  [address] Phone - [phone]   Fax - [phone]  [facility] [address] [place], [place]  [address] Phone - [phone]  Fax - [phone]  [facility] [address] [place], [place]  [address] Phone - [phone]   Fax - [phone]   [facility] [address] [place], [place]  [address] Phone - [phone]   Fax - [phone]  [facility] [address] [place], [place]  [address] Phone - [phone]   Fax - [phone]  DAVID RUBIN [address] [place], [place]  [address] Phone - [phone]   Fax - [phone]  [facility] [address] [place], [place]  [address] Phone - [phone]   Fax - [phone]  [facility] Alita Chyle [address] [place], [place]  [address] Phone - [phone]   Fax - [phone] Gerarda Fraction [address] [place], [place]  [address] Phone - [phone]   Fax - [phone]  [facility] [address] [place], [place]  [address] Phone - [phone]   Fax - [phone]  [facility] [place] [address] [place], [place]  [address] Phone - [phone]   Fax - [phone]  [facility] [place] Wyvonne Lenz MD [address] [place] [address] Phone [phone]  Fax [phone]

## 2018-02-24 NOTE — Progress Notes (Signed)
Patient is unsure if she is feeling fetal movement, denies pain. Pt complains of vaginal itching and white discharge.

## 2018-02-24 NOTE — Progress Notes (Signed)
   PRENATAL VISIT NOTE  Subjective:  Penny Clark is a 27 y.o. G2P1001 at 8537w2d being seen today for ongoing prenatal care.  She is currently monitored for the following issues for this high-risk pregnancy and has Anxiety and depression; Supervision of high risk pregnancy, antepartum, first trimester; Substance abuse (HCC); and Mild tobacco abuse on their problem list.  Patient reports no bleeding, no contractions, no cramping, no leaking and vaginal irritation.  Contractions: Not present. Vag. Bleeding: None.   . Denies leaking of fluid.   The following portions of the patient's history were reviewed and updated as appropriate: allergies, current medications, past family history, past medical history, past social history, past surgical history and problem list. Problem list updated.  Objective:   Vitals:   02/24/18 1507  BP: 128/87  Pulse: (!) 102  Weight: 130 lb 3.2 oz (59.1 kg)    Fetal Status: Fetal Heart Rate (bpm): 152; doppler Fundal Height: 16 cm       General:  Alert, oriented and cooperative. Patient is in no acute distress.  Skin: Skin is warm and dry. No rash noted.   Cardiovascular: Normal heart rate noted  Respiratory: Normal respiratory effort, no problems with respiration noted  Abdomen: Soft, gravid, appropriate for gestational age.  Pain/Pressure: Absent     Pelvic: Cervical exam deferred        Extremities: Normal range of motion.  Edema: None  Mental Status: Normal mood and affect. Normal behavior. Normal judgment and thought content.   Assessment and Plan:  Pregnancy: G2P1001 at 6337w2d  1. Supervision of high risk pregnancy, antepartum, first trimester      Anatomy scan scheduled.   - AFP, Serum, Open Spina Bifida  2. Substance abuse (HCC)      Denies any cocaine use since start of pregnancy.  Encouraged UDS, [facility]   3. Vaginal discharge during pregnancy in second trimester       - Cervicovaginal ancillary only  4. Mild tobacco  abuse      - buPROPion (WELLBUTRIN XL) 150 MG 24 hr tablet; Take 1 tablet (150 mg total) by mouth daily.  Dispense: 30 tablet; Refill: 12  5. Anxiety and depression     D/Cd Zoloft - buPROPion (WELLBUTRIN XL) 150 MG 24 hr tablet; Take 1 tablet (150 mg total) by mouth daily.  Dispense: 30 tablet; Refill: 12  6. Yeast vaginitis     - fluconazole (DIFLUCAN) 150 MG tablet; Take 1 tablet (150 mg total) by mouth once for 1 dose.  Dispense: 1 tablet; Refill: 0 - terconazole (TERAZOL 3) 0.8 % vaginal cream; Place 1 applicator vaginally at bedtime.  Dispense: 20 g; Refill: 0  Preterm labor symptoms and general obstetric precautions including but not limited to vaginal bleeding, contractions, leaking of fluid and fetal movement were reviewed in detail with the patient. Please refer to After Visit Summary for other counseling recommendations.  Return in about 1 month (around 03/24/2018) [facility]  Future Appointments  Date Time Provider Department Center  03/17/2018  1:30 PM Roe CoombsDenney, Addilyne Backs A, CNM [facility] None  03/17/2018  2:30 PM [facility] US 1 [facility]    Roe Coombsachelle A Reyhan Moronta, CNM

## 2018-02-25 ENCOUNTER — Other Ambulatory Visit: Payer: Self-pay | Admitting: Certified Nurse Midwife

## 2018-02-25 LAB — CERVICOVAGINAL ANCILLARY ONLY
BACTERIAL VAGINITIS: NEGATIVE
CANDIDA VAGINITIS: POSITIVE — AB
Chlamydia: NEGATIVE
Neisseria Gonorrhea: NEGATIVE
TRICH (WINDOWPATH): NEGATIVE

## 2018-02-27 LAB — AFP, SERUM, OPEN SPINA BIFIDA
AFP MoM: 2.1
AFP Value: 77 ng/mL
GEST. AGE ON COLLECTION DATE: 16.3 wk
Maternal Age At EDD: 28 yr
OSBR RISK 1 IN: 647
TEST RESULTS AFP: NEGATIVE
Weight: 130 [lb_av]

## 2018-03-03 ENCOUNTER — Other Ambulatory Visit: Payer: Self-pay | Admitting: Certified Nurse Midwife

## 2018-03-03 DIAGNOSIS — O0991 Supervision of high risk pregnancy, unspecified, first trimester: Secondary | ICD-10-CM

## 2018-03-17 ENCOUNTER — Encounter: Payer: Medicaid Other | Admitting: Certified Nurse Midwife

## 2018-03-17 ENCOUNTER — Ambulatory Visit (HOSPITAL_COMMUNITY)
Admission: RE | Admit: 2018-03-17 | Discharge: 2018-03-17 | Disposition: A | Discharge status: Home / Self Care | Payer: Medicaid Other | Source: Ambulatory Visit | Attending: Obstetrics and Gynecology | Admitting: Obstetrics and Gynecology

## 2018-03-17 ENCOUNTER — Other Ambulatory Visit: Payer: Self-pay | Admitting: Obstetrics and Gynecology

## 2018-03-17 DIAGNOSIS — F1910 Other psychoactive substance abuse, uncomplicated: Secondary | ICD-10-CM

## 2018-03-17 DIAGNOSIS — F17200 Nicotine dependence, unspecified, uncomplicated: Secondary | ICD-10-CM | POA: Insufficient documentation

## 2018-03-17 DIAGNOSIS — O99320 Drug use complicating pregnancy, unspecified trimester: Secondary | ICD-10-CM

## 2018-03-17 DIAGNOSIS — Z363 Encounter for antenatal screening for malformations: Secondary | ICD-10-CM

## 2018-03-17 DIAGNOSIS — Z3A19 19 weeks gestation of pregnancy: Secondary | ICD-10-CM | POA: Diagnosis not present

## 2018-03-17 DIAGNOSIS — F1410 Cocaine abuse, uncomplicated: Secondary | ICD-10-CM | POA: Diagnosis not present

## 2018-03-17 DIAGNOSIS — O99332 Smoking (tobacco) complicating pregnancy, second trimester: Secondary | ICD-10-CM

## 2018-03-17 DIAGNOSIS — F419 Anxiety disorder, unspecified: Principal | ICD-10-CM

## 2018-03-17 DIAGNOSIS — F1210 Cannabis abuse, uncomplicated: Secondary | ICD-10-CM | POA: Diagnosis not present

## 2018-03-17 DIAGNOSIS — O321XX0 Maternal care for breech presentation, not applicable or unspecified: Secondary | ICD-10-CM | POA: Diagnosis not present

## 2018-03-17 DIAGNOSIS — F329 Major depressive disorder, single episode, unspecified: Secondary | ICD-10-CM

## 2018-03-17 DIAGNOSIS — F32A Depression, unspecified: Secondary | ICD-10-CM

## 2018-03-17 DIAGNOSIS — O99322 Drug use complicating pregnancy, second trimester: Secondary | ICD-10-CM | POA: Insufficient documentation

## 2018-03-24 ENCOUNTER — Encounter: Payer: Medicaid Other | Admitting: Obstetrics & Gynecology

## 2018-03-24 NOTE — Progress Notes (Deleted)
   Patient did not show up today for her scheduled appointment.   Earla Charlie, MD, FACOG Obstetrician & Gynecologist, [facility], [facility]  

## 2018-03-25 ENCOUNTER — Telehealth: Payer: Self-pay | Admitting: *Deleted

## 2018-03-25 NOTE — Telephone Encounter (Signed)
Left vmail for patient to callback and schedule missed Ob appt. On 03/24/2018.

## 2018-04-07 ENCOUNTER — Encounter: Payer: Medicaid Other | Admitting: Certified Nurse Midwife

## 2018-04-10 ENCOUNTER — Encounter (INDEPENDENT_AMBULATORY_CARE_PROVIDER_SITE_OTHER): Payer: Self-pay

## 2018-05-07 ENCOUNTER — Encounter: Payer: Self-pay | Admitting: Certified Nurse Midwife

## 2018-05-07 ENCOUNTER — Ambulatory Visit (INDEPENDENT_AMBULATORY_CARE_PROVIDER_SITE_OTHER): Payer: Medicaid Other | Admitting: Certified Nurse Midwife

## 2018-05-07 VITALS — BP 122/72 | HR 84 | Wt 144.2 lb

## 2018-05-07 DIAGNOSIS — F1910 Other psychoactive substance abuse, uncomplicated: Secondary | ICD-10-CM

## 2018-05-07 DIAGNOSIS — O0991 Supervision of high risk pregnancy, unspecified, first trimester: Secondary | ICD-10-CM

## 2018-05-07 DIAGNOSIS — O0932 Supervision of pregnancy with insufficient antenatal care, second trimester: Secondary | ICD-10-CM

## 2018-05-07 NOTE — Progress Notes (Signed)
Pt left urine sample

## 2018-05-07 NOTE — Progress Notes (Signed)
   PRENATAL VISIT NOTE  Subjective:  Penny Clark is a 27 y.o. G2P1001 at 3962w4d being seen today for ongoing prenatal care.  She is currently monitored for the following issues for this high-risk pregnancy and has Anxiety and depression; Supervision of high risk pregnancy, antepartum, first trimester; Substance abuse (HCC); Mild tobacco abuse; and Insufficient prenatal care in second trimester on their problem list.  Patient reports no complaints.  Contractions: Not present. Vag. Bleeding: None.  Movement: Present. Denies leaking of fluid.   The following portions of the patient's history were reviewed and updated as appropriate: allergies, current medications, past family history, past medical history, past social history, past surgical history and problem list. Problem list updated.  Objective:   Vitals:   05/07/18 1547  BP: 122/72  Pulse: 84  Weight: 144 lb 3.2 oz (65.4 kg)    Fetal Status: Fetal Heart Rate (bpm): 146; doppler Fundal Height: 26 cm Movement: Present     General:  Alert, oriented and cooperative. Patient is in no acute distress.  Skin: Skin is warm and dry. No rash noted.   Cardiovascular: Normal heart rate noted  Respiratory: Normal respiratory effort, no problems with respiration noted  Abdomen: Soft, gravid, appropriate for gestational age.  Pain/Pressure: Present     Pelvic: Cervical exam deferred        Extremities: Normal range of motion.  Edema: None  Mental Status: Normal mood and affect. Normal behavior. Normal judgment and thought content.   Assessment and Plan:  Pregnancy: G2P1001 at 4462w4d  1. Supervision of high risk pregnancy, antepartum, first trimester     Has not been to the office for an OB visit since 02/24/18.   - US MFM OB FOLLOW UP; Future  2. Substance abuse (HCC)     Repeat UDS today d/t hx of cocaine abuse.   Preterm labor symptoms and general obstetric precautions including but not limited to vaginal bleeding, contractions, leaking of  fluid and fetal movement were reviewed in detail with the patient. Please refer to After Visit Summary for other counseling recommendations.  [facility] about 2 weeks (around [facility]21/2018[facility]  Future Appointments  Date Time Provider Department Center  05/20/2018  8:15 AM [facility] None  05/20/2018  8:30 AM Marny LowensteinWenzel, Julie N, PA-C [facility] None  05/21/2018  2:45 PM [facility] US 2 [facility]    Roe Coombsachelle A Kloi Brodman, CNM

## 2018-05-13 LAB — TOXASSURE SELECT 13 (MW), URINE

## 2018-05-15 ENCOUNTER — Other Ambulatory Visit: Payer: Self-pay | Admitting: Certified Nurse Midwife

## 2018-05-15 ENCOUNTER — Encounter: Payer: Self-pay | Admitting: Certified Nurse Midwife

## 2018-05-15 DIAGNOSIS — O99320 Drug use complicating pregnancy, unspecified trimester: Secondary | ICD-10-CM | POA: Insufficient documentation

## 2018-05-15 DIAGNOSIS — F1490 Cocaine use, unspecified, uncomplicated: Principal | ICD-10-CM

## 2018-05-20 ENCOUNTER — Encounter: Payer: Self-pay | Admitting: Medical

## 2018-05-20 ENCOUNTER — Other Ambulatory Visit: Payer: Medicaid Other

## 2018-05-20 ENCOUNTER — Ambulatory Visit (INDEPENDENT_AMBULATORY_CARE_PROVIDER_SITE_OTHER): Payer: Medicaid Other | Admitting: Medical

## 2018-05-20 VITALS — BP 99/64 | HR 79 | Wt 151.4 lb

## 2018-05-20 DIAGNOSIS — O0993 Supervision of high risk pregnancy, unspecified, third trimester: Secondary | ICD-10-CM

## 2018-05-20 DIAGNOSIS — Z23 Encounter for immunization: Secondary | ICD-10-CM | POA: Diagnosis not present

## 2018-05-20 DIAGNOSIS — F1910 Other psychoactive substance abuse, uncomplicated: Secondary | ICD-10-CM

## 2018-05-20 DIAGNOSIS — O0991 Supervision of high risk pregnancy, unspecified, first trimester: Secondary | ICD-10-CM

## 2018-05-20 DIAGNOSIS — F32A Depression, unspecified: Secondary | ICD-10-CM

## 2018-05-20 DIAGNOSIS — F419 Anxiety disorder, unspecified: Secondary | ICD-10-CM

## 2018-05-20 DIAGNOSIS — F329 Major depressive disorder, single episode, unspecified: Secondary | ICD-10-CM

## 2018-05-20 NOTE — Progress Notes (Signed)
Patient reports good fetal movement, denies pain. 

## 2018-05-20 NOTE — Patient Instructions (Signed)
Research childbirth classes and hospital preregistration at [email or web address]  Fetal Movement Counts Patient Name: ________________________________________________ Patient Due Date: ____________________ What is a fetal movement count? A fetal movement count is the number of times that you feel your baby move during a certain amount of time. This may also be called a fetal kick count. A fetal movement count is recommended for every pregnant woman. You may be asked to start counting fetal movements as early as week 28 of your pregnancy. Pay attention to when your baby is most active. You may notice your baby's sleep and wake cycles. You may also notice things that make your baby move more. You should do a fetal movement count:  When your baby is normally most active.  At the same time each day.  A good time to count movements is while you are resting, after having something to eat and drink. How do I count fetal movements? 1. Find a quiet, comfortable area. Sit, or lie down on your side. 2. Write down the date, the start time and stop time, and the number of movements that you felt between those two times. Take this information with you to your health care visits. 3. For 2 hours, count kicks, flutters, swishes, rolls, and jabs. You should feel at least 10 movements during 2 hours. 4. You may stop counting after you have felt 10 movements. 5. If you do not feel 10 movements in 2 hours, have something to eat and drink. Then, keep resting and counting for 1 hour. If you feel at least 4 movements during that hour, you may stop counting. Contact a health care provider if:  You feel fewer than 4 movements in 2 hours.  Your baby is not moving like he or she usually does. Date: ____________ Start time: ____________ Stop time: ____________ Movements: ____________ Date: ____________ Start time: ____________ Stop time: ____________ Movements: ____________ Date: ____________ Start time: ____________  Stop time: ____________ Movements: ____________ Date: ____________ Start time: ____________ Stop time: ____________ Movements: ____________ Date: ____________ Start time: ____________ Stop time: ____________ Movements: ____________ Date: ____________ Start time: ____________ Stop time: ____________ Movements: ____________ Date: ____________ Start time: ____________ Stop time: ____________ Movements: ____________ Date: ____________ Start time: ____________ Stop time: ____________ Movements: ____________ Date: ____________ Start time: ____________ Stop time: ____________ Movements: ____________ This information is not intended to replace advice given to you by your health care provider. Make sure you discuss any questions you have with your health care provider. Document Released: 11/21/2006 Document Revised: 06/20/2016 Document Reviewed: 12/01/2015 Elsevier Interactive Patient Education  2018 [employer]  Braxton Hicks Contractions Contractions of the uterus can occur throughout pregnancy, but they are not always a sign that you are in labor. You may have practice contractions called Braxton Hicks contractions. These false labor contractions are sometimes confused with true labor. What are Braxton Hicks contractions? Braxton Hicks contractions are tightening movements that occur in the muscles of the uterus before labor. Unlike true labor contractions, these contractions do not result in opening (dilation) and thinning of the cervix. Toward the end of pregnancy (32-34 weeks), Braxton Hicks contractions can happen more often and may become stronger. These contractions are sometimes difficult to tell apart from true labor because they can be very uncomfortable. You should not feel embarrassed if you go to the hospital with false labor. Sometimes, the only way to tell if you are in true labor is for your health care provider to look for changes in the cervix. The health care provider will   do a physical  exam and may monitor your contractions. If you are not in true labor, the exam should show that your cervix is not dilating and your water has not broken. If there are other health problems associated with your pregnancy, it is completely safe for you to be sent home with false labor. You may continue to have Braxton Hicks contractions until you go into true labor. How to tell the difference between true labor and false labor True labor  Contractions last 30-70 seconds.  Contractions become very regular.  Discomfort is usually felt in the top of the uterus, and it spreads to the lower abdomen and low back.  Contractions do not go away with walking.  Contractions usually become more intense and increase in frequency.  The cervix dilates and gets thinner. False labor  Contractions are usually shorter and not as strong as true labor contractions.  Contractions are usually irregular.  Contractions are often felt in the front of the lower abdomen and in the groin.  Contractions may go away when you walk around or change positions while lying down.  Contractions get weaker and are shorter-lasting as time goes on.  The cervix usually does not dilate or become thin. Follow these instructions at home:  Take over-the-counter and prescription medicines only as told by your health care provider.  Keep up with your usual exercises and follow other instructions from your health care provider.  Eat and drink lightly if you think you are going into labor.  If Braxton Hicks contractions are making you uncomfortable: ? Change your position from lying down or resting to walking, or change from walking to resting. ? Sit and rest in a tub of warm water. ? Drink enough fluid to keep your urine pale yellow. Dehydration may cause these contractions. ? Do slow and deep breathing several times an hour.  Keep all follow-up prenatal visits as told by your health care provider. This is  important. Contact a health care provider if:  You have a fever.  You have continuous pain in your abdomen. Get help right away if:  Your contractions become stronger, more regular, and closer together.  You have fluid leaking or gushing from your vagina.  You pass blood-tinged mucus (bloody show).  You have bleeding from your vagina.  You have low back pain that you never had before.  You feel your baby's head pushing down and causing pelvic pressure.  Your baby is not moving inside you as much as it used to. Summary  Contractions that occur before labor are called Braxton Hicks contractions, false labor, or practice contractions.  Braxton Hicks contractions are usually shorter, weaker, farther apart, and less regular than true labor contractions. True labor contractions usually become progressively stronger and regular and they become more frequent.  Manage discomfort from Braxton Hicks contractions by changing position, resting in a warm bath, drinking plenty of water, or practicing deep breathing. This information is not intended to replace advice given to you by your health care provider. Make sure you discuss any questions you have with your health care provider. Document Released: 03/07/2017 Document Revised: 03/07/2017 Document Reviewed: 03/07/2017 Elsevier Interactive Patient Education  2018 [employer]    

## 2018-05-20 NOTE — Progress Notes (Signed)
   PRENATAL VISIT NOTE  Subjective:  Penny Clark is a 27 y.o. G2P1001 at 3148w3d being seen today for ongoing prenatal care.  She is currently monitored for the following issues for this high-risk pregnancy and has Anxiety and depression; Supervision of high risk pregnancy, antepartum, first trimester; Substance abuse (HCC); Mild tobacco abuse; Insufficient prenatal care in second trimester; and Cocaine use complicating pregnancy on their problem list.  Patient reports no complaints.  Contractions: Not present. Vag. Bleeding: None.  Movement: Present. Denies leaking of fluid.   The following portions of the patient's history were reviewed and updated as appropriate: allergies, current medications, past family history, past medical history, past social history, past surgical history and problem list. Problem list updated.  Objective:   Vitals:   05/20/18 0825  BP: 99/64  Pulse: 79  Weight: 151 lb 6.4 oz (68.7 kg)    Fetal Status: Fetal Heart Rate (bpm): 142 Fundal Height: 28 cm Movement: Present     General:  Alert, oriented and cooperative. Patient is in no acute distress.  Skin: Skin is warm and dry. No rash noted.   Cardiovascular: Normal heart rate noted  Respiratory: Normal respiratory effort, no problems with respiration noted  Abdomen: Soft, gravid, appropriate for gestational age.  Pain/Pressure: Absent     Pelvic: Cervical exam deferred        Extremities: Normal range of motion.  Edema: None  Mental Status: Normal mood and affect. Normal behavior. Normal judgment and thought content.   Assessment and Plan:  Pregnancy: G2P1001 at 4048w3d  1. Supervision of high risk pregnancy, antepartum, first trimester - Tdap vaccine greater than or equal to 7yo IM - Glucose Tolerance, 2 Hours w/1 Hour - HIV antibody (with reflex) - RPR - CBC  2. Substance abuse (HCC) - + tox screen at 26 weeks  3. Anxiety and depression - On Zoloft, no complaints   Preterm labor symptoms and  general obstetric precautions including but not limited to vaginal bleeding, contractions, leaking of fluid and fetal movement were reviewed in detail with the patient. Please refer to After Visit Summary for other counseling recommendations.  Return in about 2 weeks (around 06/03/2018) for LOB.  Future Appointments  Date Time Provider Department Center  05/21/2018  2:45 PM [facility] US 2 [facility]    Vonzella NippleJulie Wenzel, PA-C

## 2018-05-21 ENCOUNTER — Ambulatory Visit (HOSPITAL_COMMUNITY)
Admission: RE | Admit: 2018-05-21 | Discharge: 2018-05-21 | Disposition: A | Discharge status: Home / Self Care | Payer: Medicaid Other | Source: Ambulatory Visit | Attending: Certified Nurse Midwife | Admitting: Certified Nurse Midwife

## 2018-05-21 DIAGNOSIS — O99333 Smoking (tobacco) complicating pregnancy, third trimester: Secondary | ICD-10-CM | POA: Diagnosis not present

## 2018-05-21 DIAGNOSIS — Z362 Encounter for other antenatal screening follow-up: Secondary | ICD-10-CM | POA: Diagnosis not present

## 2018-05-21 DIAGNOSIS — Z3A28 28 weeks gestation of pregnancy: Secondary | ICD-10-CM

## 2018-05-21 DIAGNOSIS — O321XX0 Maternal care for breech presentation, not applicable or unspecified: Secondary | ICD-10-CM | POA: Diagnosis not present

## 2018-05-21 DIAGNOSIS — O99332 Smoking (tobacco) complicating pregnancy, second trimester: Secondary | ICD-10-CM

## 2018-05-21 DIAGNOSIS — O99323 Drug use complicating pregnancy, third trimester: Secondary | ICD-10-CM | POA: Diagnosis not present

## 2018-05-21 DIAGNOSIS — F17200 Nicotine dependence, unspecified, uncomplicated: Secondary | ICD-10-CM | POA: Insufficient documentation

## 2018-05-21 DIAGNOSIS — F1410 Cocaine abuse, uncomplicated: Secondary | ICD-10-CM

## 2018-05-21 DIAGNOSIS — O0993 Supervision of high risk pregnancy, unspecified, third trimester: Secondary | ICD-10-CM | POA: Diagnosis not present

## 2018-05-21 DIAGNOSIS — O0991 Supervision of high risk pregnancy, unspecified, first trimester: Secondary | ICD-10-CM

## 2018-05-21 DIAGNOSIS — F1210 Cannabis abuse, uncomplicated: Secondary | ICD-10-CM | POA: Diagnosis not present

## 2018-05-21 LAB — GLUCOSE TOLERANCE, 2 HOURS W/ 1HR
GLUCOSE, 2 HOUR: 73 mg/dL (ref 65–152)
Glucose, 1 hour: 78 mg/dL (ref 65–179)
Glucose, Fasting: 72 mg/dL (ref 65–91)

## 2018-05-21 LAB — CBC
HEMOGLOBIN: 11.8 g/dL (ref 11.1–15.9)
Hematocrit: 36 % (ref 34.0–46.6)
MCH: 29.3 pg (ref 26.6–33.0)
MCHC: 32.8 g/dL (ref 31.5–35.7)
MCV: 89 fL (ref 79–97)
Platelets: 201 10*3/uL (ref 150–450)
RBC: 4.03 x10E6/uL (ref 3.77–5.28)
RDW: 14 % (ref 12.3–15.4)
WBC: 8 10*3/uL (ref 3.4–10.8)

## 2018-05-21 LAB — HIV ANTIBODY (ROUTINE TESTING W REFLEX): HIV SCREEN 4TH GENERATION: NONREACTIVE

## 2018-05-21 LAB — RPR: RPR: NONREACTIVE

## 2018-05-27 ENCOUNTER — Encounter: Payer: Self-pay | Admitting: Medical

## 2018-06-03 ENCOUNTER — Ambulatory Visit (INDEPENDENT_AMBULATORY_CARE_PROVIDER_SITE_OTHER): Payer: Medicaid Other | Admitting: Obstetrics & Gynecology

## 2018-06-03 ENCOUNTER — Encounter: Payer: Self-pay | Admitting: Obstetrics & Gynecology

## 2018-06-03 ENCOUNTER — Other Ambulatory Visit (HOSPITAL_COMMUNITY)
Admission: RE | Admit: 2018-06-03 | Discharge: 2018-06-03 | Disposition: A | Discharge status: Home / Self Care | Payer: Medicaid Other | Source: Ambulatory Visit | Attending: Obstetrics & Gynecology | Admitting: Obstetrics & Gynecology

## 2018-06-03 VITALS — BP 129/68 | HR 83 | Wt 154.9 lb

## 2018-06-03 DIAGNOSIS — N898 Other specified noninflammatory disorders of vagina: Secondary | ICD-10-CM | POA: Insufficient documentation

## 2018-06-03 DIAGNOSIS — F1910 Other psychoactive substance abuse, uncomplicated: Secondary | ICD-10-CM

## 2018-06-03 DIAGNOSIS — O0991 Supervision of high risk pregnancy, unspecified, first trimester: Secondary | ICD-10-CM

## 2018-06-03 NOTE — Patient Instructions (Signed)

## 2018-06-03 NOTE — Progress Notes (Signed)
   PRENATAL VISIT NOTE  Subjective:  Penny Clark is a 27 y.o. G2P1001 at 2655w3d being seen today for ongoing prenatal care.  She is currently monitored for the following issues for this high-risk pregnancy and has Anxiety and depression; Supervision of high risk pregnancy, antepartum, first trimester; Substance abuse (HCC); Mild tobacco abuse; Insufficient prenatal care in second trimester; and Cocaine use complicating pregnancy on their problem list.  Patient reports vaginal irritation.  Contractions: Not present. Vag. Bleeding: None.  Movement: Present. Denies leaking of fluid.   The following portions of the patient's history were reviewed and updated as appropriate: allergies, current medications, past family history, past medical history, past social history, past surgical history and problem list. Problem list updated.  Objective:   Vitals:   06/03/18 1002  BP: 129/68  Pulse: 83  Weight: 70.3 kg (154 lb 14.4 oz)    Fetal Status:     Movement: Present     General:  Alert, oriented and cooperative. Patient is in no acute distress.  Skin: Skin is warm and dry. No rash noted.   Cardiovascular: Normal heart rate noted  Respiratory: Normal respiratory effort, no problems with respiration noted  Abdomen: Soft, gravid, appropriate for gestational age.  Pain/Pressure: Absent     Pelvic: Cervical exam deferred        Extremities: Normal range of motion.  Edema: None  Mental Status: Normal mood and affect. Normal behavior. Normal judgment and thought content.   Assessment and Plan:  Pregnancy: G2P1001 at 6255w3d  1. Vaginal discharge Self swab - Cervicovaginal ancillary only  2. Supervision of high risk pregnancy, antepartum, first trimester   3. Substance abuse (HCC) Consider growth US , rescreen   Preterm labor symptoms and general obstetric precautions including but not limited to vaginal bleeding, contractions, leaking of fluid and fetal movement were reviewed in detail with  the patient. Please refer to After Visit Summary for other counseling recommendations.  Return in about 2 weeks (around 06/17/2018).  No future appointments.  Scheryl DarterJames Zoran Yankee, MD

## 2018-06-03 NOTE — Progress Notes (Signed)
Pt wants swab to confirm yeast or BV.

## 2018-06-04 LAB — CERVICOVAGINAL ANCILLARY ONLY
BACTERIAL VAGINITIS: NEGATIVE
CANDIDA VAGINITIS: POSITIVE — AB
Chlamydia: NEGATIVE
Neisseria Gonorrhea: NEGATIVE
Trichomonas: POSITIVE — AB

## 2018-06-05 ENCOUNTER — Other Ambulatory Visit: Payer: Self-pay | Admitting: Obstetrics & Gynecology

## 2018-06-05 DIAGNOSIS — O0991 Supervision of high risk pregnancy, unspecified, first trimester: Secondary | ICD-10-CM

## 2018-06-05 MED ORDER — METRONIDAZOLE 500 MG PO TABS: ORAL_TABLET | ORAL | 0 refills | Status: DC

## 2018-06-05 MED ORDER — FLUCONAZOLE 150 MG PO TABS: 150.0000 mg | ORAL_TABLET | Freq: Once | ORAL | 0 refills | Status: AC

## 2018-06-05 NOTE — Progress Notes (Signed)
Rx for didflucan and flagyl sent to Rx

## 2018-06-17 ENCOUNTER — Encounter: Payer: Medicaid Other | Admitting: Family Medicine

## 2018-06-17 ENCOUNTER — Encounter: Payer: Self-pay | Admitting: Family Medicine

## 2018-06-17 NOTE — Progress Notes (Signed)
Patient did not keep appointment today. She will be called to reschedule.  

## 2018-06-24 ENCOUNTER — Encounter: Payer: Medicaid Other | Admitting: Obstetrics and Gynecology

## 2018-07-29 ENCOUNTER — Encounter: Payer: Medicaid Other | Admitting: Obstetrics & Gynecology

## 2018-08-05 ENCOUNTER — Telehealth (HOSPITAL_COMMUNITY): Payer: Self-pay | Admitting: *Deleted

## 2018-08-05 ENCOUNTER — Other Ambulatory Visit (HOSPITAL_COMMUNITY)
Admission: RE | Admit: 2018-08-05 | Discharge: 2018-08-05 | Disposition: A | Discharge status: Home / Self Care | Payer: Medicaid Other | Source: Ambulatory Visit | Attending: Obstetrics & Gynecology | Admitting: Obstetrics & Gynecology

## 2018-08-05 ENCOUNTER — Ambulatory Visit (INDEPENDENT_AMBULATORY_CARE_PROVIDER_SITE_OTHER): Payer: Medicaid Other | Admitting: Obstetrics & Gynecology

## 2018-08-05 ENCOUNTER — Encounter: Payer: Self-pay | Admitting: Obstetrics & Gynecology

## 2018-08-05 VITALS — BP 134/90 | HR 91 | Wt 155.4 lb

## 2018-08-05 DIAGNOSIS — F17200 Nicotine dependence, unspecified, uncomplicated: Secondary | ICD-10-CM

## 2018-08-05 DIAGNOSIS — O0993 Supervision of high risk pregnancy, unspecified, third trimester: Secondary | ICD-10-CM | POA: Insufficient documentation

## 2018-08-05 DIAGNOSIS — O0991 Supervision of high risk pregnancy, unspecified, first trimester: Secondary | ICD-10-CM

## 2018-08-05 DIAGNOSIS — O99320 Drug use complicating pregnancy, unspecified trimester: Secondary | ICD-10-CM

## 2018-08-05 DIAGNOSIS — Z3A39 39 weeks gestation of pregnancy: Secondary | ICD-10-CM | POA: Insufficient documentation

## 2018-08-05 DIAGNOSIS — F1490 Cocaine use, unspecified, uncomplicated: Secondary | ICD-10-CM

## 2018-08-05 DIAGNOSIS — F419 Anxiety disorder, unspecified: Secondary | ICD-10-CM

## 2018-08-05 DIAGNOSIS — O0932 Supervision of pregnancy with insufficient antenatal care, second trimester: Secondary | ICD-10-CM

## 2018-08-05 DIAGNOSIS — F1910 Other psychoactive substance abuse, uncomplicated: Secondary | ICD-10-CM

## 2018-08-05 DIAGNOSIS — F329 Major depressive disorder, single episode, unspecified: Secondary | ICD-10-CM

## 2018-08-05 NOTE — Telephone Encounter (Signed)
Preadmission screen  

## 2018-08-05 NOTE — Progress Notes (Signed)
Patient reports good fetal movement, denies pain. 

## 2018-08-05 NOTE — Progress Notes (Addendum)
   PRENATAL VISIT NOTE  Subjective:  Penny Clark is a 27 y.o. G2P1001 at [redacted]w[redacted]d being seen today for ongoing prenatal care.  She is currently monitored for the following issues for this high-risk pregnancy and has Anxiety and depression; Supervision of high risk pregnancy, antepartum, first trimester; Substance abuse (HCC); Mild tobacco abuse; Insufficient prenatal care in second trimester; and Cocaine use complicating pregnancy on their problem list.  Patient reports no complaints.  Contractions: Not present. Vag. Bleeding: None.  Movement: Present. Denies leaking of fluid.   The following portions of the patient's history were reviewed and updated as appropriate: allergies, current medications, past family history, past medical history, past social history, past surgical history and problem list. Problem list updated.  Objective:   Vitals:   08/05/18 1108  BP: 134/90  Pulse: 91  Weight: 155 lb 6.4 oz (70.5 kg)    Fetal Status:     Movement: Present     General:  Alert, oriented and cooperative. Patient is in no acute distress.  Skin: Skin is warm and dry. No rash noted.   Cardiovascular: Normal heart rate noted  Respiratory: Normal respiratory effort, no problems with respiration noted  Abdomen: Soft, gravid, appropriate for gestational age.  Pain/Pressure: Absent     Pelvic: Cervical exam performed        Extremities: Normal range of motion.  Edema: None  Mental Status: Normal mood and affect. Normal behavior. Normal judgment and thought content.   Assessment and Plan:  Pregnancy: G2P1001 at [redacted]w[redacted]d  1. Supervision of high risk pregnancy, antepartum, first trimester Limited PNC Order placed for IOL at 41 weeks of undel (see note below)  2. Substance abuse (HCC) Pt has a letter from [facility] for [employer] program which is a perinatal substance abuse program. The letter has a date for 10/16th but, pt hstates that the date was changed to 10/9.  Pt has agreed to keep all of her  appts until the program and to notify us (have the program notify us) if she goes into the program  PRIOR to delivery.   3. Mild tobacco abuse  4. Insufficient prenatal care in second trimester 5. Cocaine use complicating pregnancy Last use reported 1 week prev   6. Anxiety and depression  7. S<D Will obtain f/u US for growth   NOTE: [facility] did confirm that pt has a bed there reserve for Oct 9th. They have stated that if she goes into labor and gets there she can deliver and stay.   Term labor symptoms and general obstetric precautions including but not limited to vaginal bleeding, contractions, leaking of fluid and fetal movement were reviewed in detail with the patient. Please refer to After Visit Summary for other counseling recommendations.  Return in about 1 week (around 08/12/2018).  No future appointments.  Willodean Rosenthal, MD

## 2018-08-05 NOTE — Patient Instructions (Signed)
Natural Childbirth Natural childbirth is going through labor and delivery without any drugs to relieve pain. Additionally, fetal monitors are not used, a delivery is not done, and a surgical cut to enlarge the vaginal opening (episiotomy) is not made. With the help of a birthing professional (midwife or health care provider), you direct your own labor and delivery. Many women choose natural childbirth because it makes them feel more in control and in touch with their labor and delivery. Some woman also choose natural childbirth because they are concerned about medicines affecting them and their babies. Pregnant women with a high-risk pregnancy should not attempt natural childbirth. It is better to deliver the infant in a hospital if an emergency situation arises. Sometimes, a health care provider has to get involved for the health and safety of the mother and infant. Techniques for natural childbirth  The Lamaze method-This method teaches parents that having a baby is normal, healthy, and natural. It also teaches the mother to take a neutral position regarding pain medicine and numbing medicines and to make an informed decision about using these medicines when the time comes.  The Bradley method (also called husband-coached birth)-This method teaches the father or partner to be the birth coach. It encourages the mother to exercise and eat a balanced, nutritious diet. It also involves relaxation and deep breathing exercises and preparing the parents for emergency situations. Methods of dealing with labor pain and delivery  Meditation.  Yoga.  Hypnosis.  Acupuncture.  Massage.  Changing positions (walking, rocking, showering, leaning on birth balls).  Lying in warm water or a whirlpool bath.  Finding an activity that keeps your mind off of the labor pain.  Listening to soft music.  Focusing on a particular object (visual imagery). Before going into labor  Be sure you and your spouse or  partner are in agreement about having a natural childbirth.  Decide if your health care provider or a midwife will deliver your baby.  Decide if you will have your baby in the hospital, at a birthing center, or at home.  If you have children, make plans to have someone take care of them when you go to the hospital or birthing center.  Know the distance and the time it takes to go to the hospital or birthing center. Practice going there and time it to be sure.  Have a bag packed with a nightgown, bathrobe, and toiletries. Be ready to take it with you when you go into labor.  Keep phone numbers of your family and friends handy if you need to call someone when you go into labor.  Your spouse or partner should go to all the natural childbirth technique classes.  Talk with your health care provider about the possibility of a medical emergency and what will happen if that occurs. Advantages of natural childbirth  You are in control of your labor and delivery.  You will not take medicines that could affect you and the baby.  There are no invasive procedures, such as an episiotomy.  You and your spouse or partner will work together, which can increase your bond with each other.  In most delivery centers, your family and friends can be involved in the labor and delivery process. Disadvantages of natural childbirth  You will experience pain during your labor and delivery.  The methods of helping relieve your labor pains may not work for you.  You may feel disappointed if you decide to change your mind during labor and not   have a natural childbirth. After the delivery  You will be very tired.  You will be uncomfortable because of your uterus contracting. You will feel soreness around the vagina.  You may feel cold and shaky. This is a natural reaction. This information is not intended to replace advice given to you by your health care provider. Make sure you discuss any questions you  have with your health care provider. Document Released: 10/04/2008 Document Revised: 03/29/2016 Document Reviewed: 06/29/2013 Elsevier Interactive Patient Education  2017 Elsevier Inc.  

## 2018-08-06 ENCOUNTER — Other Ambulatory Visit: Payer: Self-pay | Admitting: Family Medicine

## 2018-08-06 LAB — CERVICOVAGINAL ANCILLARY ONLY
Chlamydia: NEGATIVE
Neisseria Gonorrhea: NEGATIVE

## 2018-08-07 ENCOUNTER — Encounter: Payer: Self-pay | Admitting: Obstetrics & Gynecology

## 2018-08-07 ENCOUNTER — Ambulatory Visit (HOSPITAL_COMMUNITY)
Admission: RE | Admit: 2018-08-07 | Discharge: 2018-08-07 | Disposition: A | Discharge status: Home / Self Care | Payer: Medicaid Other | Source: Ambulatory Visit | Attending: Obstetrics & Gynecology | Admitting: Obstetrics & Gynecology

## 2018-08-07 DIAGNOSIS — Z3A39 39 weeks gestation of pregnancy: Secondary | ICD-10-CM | POA: Diagnosis not present

## 2018-08-07 DIAGNOSIS — O99323 Drug use complicating pregnancy, third trimester: Secondary | ICD-10-CM | POA: Diagnosis present

## 2018-08-07 DIAGNOSIS — F1910 Other psychoactive substance abuse, uncomplicated: Secondary | ICD-10-CM

## 2018-08-07 DIAGNOSIS — Z362 Encounter for other antenatal screening follow-up: Secondary | ICD-10-CM | POA: Diagnosis not present

## 2018-08-07 DIAGNOSIS — O99333 Smoking (tobacco) complicating pregnancy, third trimester: Secondary | ICD-10-CM | POA: Insufficient documentation

## 2018-08-07 DIAGNOSIS — B951 Streptococcus, group B, as the cause of diseases classified elsewhere: Secondary | ICD-10-CM | POA: Insufficient documentation

## 2018-08-07 DIAGNOSIS — O26843 Uterine size-date discrepancy, third trimester: Secondary | ICD-10-CM | POA: Insufficient documentation

## 2018-08-07 DIAGNOSIS — F17200 Nicotine dependence, unspecified, uncomplicated: Secondary | ICD-10-CM

## 2018-08-07 LAB — STREP GP B NAA: Strep Gp B NAA: POSITIVE — AB

## 2018-08-12 ENCOUNTER — Encounter: Payer: Medicaid Other | Admitting: Obstetrics & Gynecology

## 2018-08-12 ENCOUNTER — Encounter (HOSPITAL_COMMUNITY): Payer: Self-pay

## 2018-08-12 ENCOUNTER — Inpatient Hospital Stay (HOSPITAL_COMMUNITY)
Admission: AD | Admit: 2018-08-12 | Discharge: 2018-08-14 | DRG: 776 | Disposition: A | Discharge status: Home / Self Care | Payer: Medicaid Other | Attending: Obstetrics and Gynecology | Admitting: Obstetrics and Gynecology

## 2018-08-12 DIAGNOSIS — F17210 Nicotine dependence, cigarettes, uncomplicated: Secondary | ICD-10-CM | POA: Diagnosis present

## 2018-08-12 DIAGNOSIS — O99335 Smoking (tobacco) complicating the puerperium: Secondary | ICD-10-CM | POA: Diagnosis present

## 2018-08-12 DIAGNOSIS — Z3A40 40 weeks gestation of pregnancy: Secondary | ICD-10-CM

## 2018-08-12 DIAGNOSIS — F1290 Cannabis use, unspecified, uncomplicated: Secondary | ICD-10-CM

## 2018-08-12 DIAGNOSIS — O99325 Drug use complicating the puerperium: Secondary | ICD-10-CM | POA: Diagnosis present

## 2018-08-12 DIAGNOSIS — B951 Streptococcus, group B, as the cause of diseases classified elsewhere: Secondary | ICD-10-CM

## 2018-08-12 DIAGNOSIS — F1490 Cocaine use, unspecified, uncomplicated: Secondary | ICD-10-CM | POA: Diagnosis present

## 2018-08-12 DIAGNOSIS — O99824 Streptococcus B carrier state complicating childbirth: Secondary | ICD-10-CM | POA: Diagnosis present

## 2018-08-12 DIAGNOSIS — O99324 Drug use complicating childbirth: Secondary | ICD-10-CM

## 2018-08-12 DIAGNOSIS — F1910 Other psychoactive substance abuse, uncomplicated: Secondary | ICD-10-CM

## 2018-08-12 LAB — RAPID URINE DRUG SCREEN, HOSP PERFORMED
AMPHETAMINES: NOT DETECTED
Barbiturates: NOT DETECTED
Benzodiazepines: NOT DETECTED
Cocaine: POSITIVE — AB
Opiates: NOT DETECTED
TETRAHYDROCANNABINOL: NOT DETECTED

## 2018-08-12 MED ORDER — ONDANSETRON HCL 4 MG PO TABS: 4.0000 mg | ORAL_TABLET | ORAL | Status: DC | PRN

## 2018-08-12 MED ORDER — DIPHENHYDRAMINE HCL 25 MG PO CAPS: 25.0000 mg | ORAL_CAPSULE | Freq: Four times a day (QID) | ORAL | Status: DC | PRN

## 2018-08-12 MED ORDER — TETANUS-DIPHTH-ACELL PERTUSSIS 5-2.5-18.5 LF-MCG/0.5 IM SUSP: 0.5000 mL | Freq: Once | INTRAMUSCULAR | Status: DC

## 2018-08-12 MED ORDER — ONDANSETRON HCL 4 MG/2ML IJ SOLN: 4.0000 mg | INTRAMUSCULAR | Status: DC | PRN

## 2018-08-12 MED ORDER — BENZOCAINE-MENTHOL 20-0.5 % EX AERO
1.0000 "application " | INHALATION_SPRAY | CUTANEOUS | Status: DC | PRN
  Filled 2018-08-12 (×2): qty 56

## 2018-08-12 MED ORDER — ACETAMINOPHEN 325 MG PO TABS
650.0000 mg | ORAL_TABLET | ORAL | Status: DC | PRN
  Administered 2018-08-12: 650 mg via ORAL
  Filled 2018-08-12: qty 2

## 2018-08-12 MED ORDER — IBUPROFEN 600 MG PO TABS
600.0000 mg | ORAL_TABLET | Freq: Four times a day (QID) | ORAL | Status: DC
  Administered 2018-08-12 – 2018-08-14 (×9): 600 mg via ORAL
  Filled 2018-08-12 (×9): qty 1

## 2018-08-12 MED ORDER — SENNOSIDES-DOCUSATE SODIUM 8.6-50 MG PO TABS
2.0000 | ORAL_TABLET | ORAL | Status: DC
  Administered 2018-08-12 – 2018-08-14 (×2): 2 via ORAL
  Filled 2018-08-12 (×2): qty 2

## 2018-08-12 MED ORDER — SIMETHICONE 80 MG PO CHEW: 80.0000 mg | CHEWABLE_TABLET | ORAL | Status: DC | PRN

## 2018-08-12 MED ORDER — COCONUT OIL OIL
1.0000 "application " | TOPICAL_OIL | Status: DC | PRN
  Filled 2018-08-12: qty 120

## 2018-08-12 MED ORDER — ZOLPIDEM TARTRATE 5 MG PO TABS: 5.0000 mg | ORAL_TABLET | Freq: Every evening | ORAL | Status: DC | PRN

## 2018-08-12 MED ORDER — PRENATAL MULTIVITAMIN CH
1.0000 | ORAL_TABLET | Freq: Every day | ORAL | Status: DC
  Administered 2018-08-12 – 2018-08-14 (×3): 1 via ORAL
  Filled 2018-08-12 (×4): qty 1

## 2018-08-12 MED ORDER — WITCH HAZEL-GLYCERIN EX PADS: 1.0000 "application " | MEDICATED_PAD | CUTANEOUS | Status: DC | PRN

## 2018-08-12 MED ORDER — DIBUCAINE 1 % RE OINT
1.0000 "application " | TOPICAL_OINTMENT | RECTAL | Status: DC | PRN
  Filled 2018-08-12: qty 28

## 2018-08-12 NOTE — Progress Notes (Signed)
Nurse noticed trend in elevated BP. MD not notified. Rn notified Philipp Deputy Nurse Midwife. No new orders received. Will check patient in the AM. Call back if BP worsens overnight.

## 2018-08-12 NOTE — Progress Notes (Signed)
CSW acknowledges consult and completed clinical assessment.  Clinical documentation will follow.  There are barriers to d/c. CPS report made to [place] CPS worker, P. Miller.  Dacy Enrico Boyd-Gilyard, MSW, LCSW Clinical Social Work [phone] 

## 2018-08-12 NOTE — MAU Note (Signed)
Pt is a G2P2 at 40.3 weeks, delivery at home at aprox. 0600 per pt.  Placenta had delivered, small hemostatic abrasion noted on right labia.  Pt reports using cocaine prior to delivery.  Uterus firm and 2 below, small amount of bleeding.    Newborn skin to skin on arrival.  Newborn taken to warmer for assessment and thermoregulation.

## 2018-08-12 NOTE — H&P (Signed)
LABOR AND DELIVERY ADMISSION HISTORY AND PHYSICAL NOTE  Penny Clark is a 27 y.o. female G2P1001 with IUP at [redacted]w[redacted]d by [place] presenting to MAU after delivery at home around 0600.  Prenatal History/Complications: PNC at [facility] Pregnancy complications:  - Substance abuse, cocaine use today prior to delivery  - Tobacco abuse  - THC use  - Anxiety and depression, not currently on medication  - GBS positive   Past Medical History: Past Medical History:  Diagnosis Date  . Anemia   . Breastfeeding (infant) 07/16/2013  . Cocaine abuse (HCC)   . Normal delivery 07/16/2013    Past Surgical History: Past Surgical History:  Procedure Laterality Date  . NO PAST SURGERIES      Obstetrical History: OB History    Gravida  2   Para  1   Term  1   Preterm      AB      Living  1     SAB      TAB      Ectopic      Multiple      Live Births  1           Social History: Social History   Socioeconomic History  . Marital status: Single    Spouse name: n/a  . Number of children: 1  . Years of education: 12+  . Highest education level: Not on file  Occupational History  . Occupation: Training and development officer: [employer]  Social Needs  . Financial resource strain: Not on file  . Food insecurity:    Worry: Not on file    Inability: Not on file  . Transportation needs:    Medical: Not on file    Non-medical: Not on file  Tobacco Use  . Smoking status: Current Some Day Smoker    Packs/day: 0.50    Types: Cigarettes, E-cigarettes    Last attempt to quit: 10/31/2012    Years since quitting: 5.7  . Smokeless tobacco: Never Used  Substance and Sexual Activity  . Alcohol use: Yes    Comment: social-before pregnancy  . Drug use: Yes    Types: Cocaine    Comment: Pt reports last cocaine use was 12/12/2017.  Marland Kitchen Sexual activity: Yes    Partners: Male    Birth control/protection: None  Lifestyle  . Physical activity:    Days per week: Not on file    Minutes per  session: Not on file  . Stress: Not on file  Relationships  . Social connections:    Talks on phone: Not on file    Gets together: Not on file    Attends religious service: Not on file    Active member of club or organization: Not on file    Attends meetings of clubs or organizations: Not on file    Relationship status: Not on file  Other Topics Concern  . Not on file  Social History Narrative   Lives alone.   Her daughter lives with her father.    Her family lives nearby.    Family History: Family History  Problem Relation Age of Onset  . Cancer Maternal Grandfather     Allergies: No Known Allergies  Medications Prior to Admission  Medication Sig Dispense Refill Last Dose  . buPROPion (WELLBUTRIN XL) 150 MG 24 hr tablet Take 1 tablet (150 mg total) by mouth daily. 30 tablet 12 Taking  . metroNIDAZOLE (FLAGYL) 500 MG tablet Take two tablets by mouth twice  a day, for one day.  Or you can take all four tablets at once if you can tolerate it. (Patient not taking: Reported on 08/05/2018) 4 tablet 0 Not Taking  . Prenat w/o A-FeCbGl-DSS-FA-DHA (CITRANATAL 90 DHA) 90-1 & 300 MG MISC Take 1 tablet by mouth daily.  11 Taking  . terconazole (TERAZOL 3) 0.8 % vaginal cream Place 1 applicator vaginally at bedtime. (Patient not taking: Reported on 05/07/2018) 20 g 0 Not Taking   Review of Systems  All systems reviewed and negative except as stated in HPI  Physical Exam Last menstrual period 11/02/2017. General appearance: alert and cooperative Lungs: clear to auscultation bilaterally Heart: regular rate and rhythm Abdomen: soft, non-tender; bowel sounds normal Extremities: No calf swelling or tenderness  Prenatal labs: ABO, Rh: B/Positive/-- (03/14 1649) Antibody: Negative (03/14 1649) Rubella: 1.50 (03/14 1649) RPR: Non Reactive (07/16 1021)  HBsAg: Negative (03/14 1649)  HIV: Non Reactive (07/16 1021)  GC/Chlamydia: negative (10/1) GBS: Positive (10/01 1408)  2 hr Glucola:  72-78-73 Genetic screening:  Negative  Anatomy US: normal female  Clinic [facility] Prenatal Labs  Dating [place] Blood type: B/Positive/-- (03/14 1649)B Positive   Genetic Screen AFP: Neg    NIPS: Panorama: neg Antibody:Negative (03/14 1649)  Anatomic [place] Incomplete- needs follow up ultrasound Rubella: 1.50 (03/14 1649)  GTT Early:               Third trimester: 72/78/73 RPR: Non Reactive (07/16 1021)   Flu vaccine Declined HBsAg: Negative (03/14 1649)   TDaP vaccine  05-20-18                                          Rhogam: NA HIV: Non Reactive (07/16 1021)   Baby Food Breast                                              GBS: (For PCN allergy, check sensitivities)  Contraception Undecided 08-05-18.  Pap: negative(01/16/2018)  Circumcision Yes if a boy   Pediatrician Undecided 08-05-18 CF: negative  Support Person Mom-Leigh Amara SMA 2 copies, low risk  Prenatal Classes  Hgb electrophoresis: Normal   Prenatal Transfer Tool  Maternal Diabetes: No Genetic Screening: Normal Maternal Ultrasounds/Referrals: Normal Fetal Ultrasounds or other Referrals:  None Maternal Substance Abuse:  Yes:  Type: Smoker, Marijuana, Cocaine Significant Maternal Medications:  None Significant Maternal Lab Results: Lab values include: Group B Strep positive  No results found for this or any previous visit (from the past 24 hour(s)).  Patient Active Problem List   Diagnosis Date Noted  . Positive GBS test 08/07/2018  . Cocaine use complicating pregnancy 05/15/2018  . Insufficient prenatal care in second trimester 05/07/2018  . Mild tobacco abuse 02/24/2018  . Supervision of high risk pregnancy, antepartum, first trimester 12/19/2017  . Substance abuse (HCC) 12/19/2017  . Anxiety and depression 07/09/2014    Assessment: Penny Clark is a 27 y.o. G2P1001 at [redacted]w[redacted]d here for SVD at home   #ID: GBS positive- not treated  #MOF: Breast #MOC: Unsure #Circ:  n/a  Penny Clark, CNM 08/12/2018, 7:42 AM

## 2018-08-13 LAB — COMPREHENSIVE METABOLIC PANEL
ALT: 13 U/L (ref 0–44)
AST: 22 U/L (ref 15–41)
Albumin: 2.4 g/dL — ABNORMAL LOW (ref 3.5–5.0)
Alkaline Phosphatase: 133 U/L — ABNORMAL HIGH (ref 38–126)
Anion gap: 7 (ref 5–15)
BUN: 8 mg/dL (ref 6–20)
CO2: 23 mmol/L (ref 22–32)
Calcium: 8.3 mg/dL — ABNORMAL LOW (ref 8.9–10.3)
Chloride: 107 mmol/L (ref 98–111)
Creatinine, Ser: 0.63 mg/dL (ref 0.44–1.00)
GFR calc Af Amer: >60 mL/min (ref >60)
GFR calc non Af Amer: >60 mL/min (ref >60)
GLUCOSE: 88 mg/dL (ref 70–99)
POTASSIUM: 3.9 mmol/L (ref 3.5–5.1)
Sodium: 137 mmol/L (ref 135–145)
Total Bilirubin: 0.2 mg/dL — ABNORMAL LOW (ref 0.3–1.2)
Total Protein: 5.8 g/dL — ABNORMAL LOW (ref 6.5–8.1)

## 2018-08-13 LAB — CBC WITH DIFFERENTIAL/PLATELET
Basophils Absolute: 0 10*3/uL (ref 0.0–0.1)
Basophils Relative: 0 %
Eosinophils Absolute: 0.1 10*3/uL (ref 0.0–0.5)
Eosinophils Relative: 1 %
HEMATOCRIT: 34.6 % — AB (ref 36.0–46.0)
Hemoglobin: 11.2 g/dL — ABNORMAL LOW (ref 12.0–15.0)
LYMPHS PCT: 21 %
Lymphs Abs: 2.1 10*3/uL (ref 0.7–4.0)
MCH: 27.2 pg (ref 26.0–34.0)
MCHC: 32.4 g/dL (ref 30.0–36.0)
MCV: 84 fL (ref 80.0–100.0)
MONO ABS: 0.5 10*3/uL (ref 0.1–1.0)
MONOS PCT: 5 %
NEUTROS ABS: 7.5 10*3/uL (ref 1.7–7.7)
Neutrophils Relative %: 73 %
Platelets: 195 10*3/uL (ref 150–400)
RBC: 4.12 MIL/uL (ref 3.87–5.11)
RDW: 13.8 % (ref 11.5–15.5)
WBC: 10.3 10*3/uL (ref 4.0–10.5)
nRBC: 0 % (ref 0.0–0.2)

## 2018-08-13 LAB — TYPE AND SCREEN
ABO/RH(D): B POS
Antibody Screen: NEGATIVE

## 2018-08-13 LAB — ABO/RH: ABO/RH(D): B POS

## 2018-08-13 MED ORDER — AMLODIPINE BESYLATE 5 MG PO TABS
5.0000 mg | ORAL_TABLET | Freq: Every day | ORAL | Status: DC
  Administered 2018-08-13: 5 mg via ORAL
  Filled 2018-08-13 (×2): qty 1

## 2018-08-13 NOTE — Progress Notes (Signed)
Post Partum Day 1 Subjective: Doing well.  Sister in room. Having some cramping but minimal pain. Denies headaches, blurry vision, abdominal pain.   Objective: Blood pressure (!) 146/95, pulse 76, temperature (!) 97.5 F (36.4 C), temperature source Oral, resp. rate 20, last menstrual period 11/02/2017, SpO2 100 %, unknown if currently breastfeeding.  Physical Exam:  General: alert, well-appearing, NAD Lochia: appropriate Uterine Fundus: firm Incision: n/a DVT Evaluation: No significant calf/ankle edema.  Recent Labs    08/13/18 1222  HGB 11.2*  HCT 34.6*    Assessment/Plan: Plan for discharge tomorrow  Elevated Bps postpartum - likely related to cocaine use. HELLP labs wnl. Will start amlodipine 5mg  daily and continue to monitor BP. Consider enrolling in postpartum Babyscripts.  SW to see because of polysubstance use, anxiety/depression.    LOS: 1 day   Tamera Stands, DO 08/13/2018, 3:38 PM

## 2018-08-13 NOTE — Progress Notes (Signed)
CSW was informed that CPS worker, Alexis Robertson ([phone]) has been assigned to [name]'s case.  CPS is in the initial stage of investigation and will contact CSW to share CPS safety discharge plan prior to infant's discharge.   Mashonda Broski Boyd-Gilyard, MSW, LCSW Clinical Social Work [phone]  

## 2018-08-13 NOTE — Clinical Social Work Maternal (Signed)
CLINICAL SOCIAL WORK MATERNAL/CHILD NOTE  Patient Details  Name: Penny Clark MRN: [id number] Date of Birth: [birth year]  Date:  08/13/2018  Clinical Social Worker Initiating Note:  Laurey Arrow Date/Time: Initiated:  08/12/18/1305     Child's Name:  Penny Clark   Biological Parents:  Mother, Father([name] refused to provide FOB's information.)   Need for Interpreter:  None   Reason for Referral:  Current Substance Use/Substance Use During Pregnancy    Address:  [place] [address]    Phone number:  [phone] (home)     Additional phone number:   Household Members/Support Persons (HM/SP):   Household Member/Support Person 1([name] has an older daughter Bari Edward) Whom she does no have custody of. Per [name], Dellie Catholic resides with her father Lynelle Smoke  (12/26/1972) [phone].)   HM/SP Name Relationship DOB or Age  HM/SP -1 Patience Musca daughter  07/14/2013  HM/SP -2        HM/SP -3        HM/SP -4        HM/SP -5        HM/SP -6        HM/SP -7        HM/SP -8          Natural Supports (not living in the home):  Immediate Family(Kaydens' father and girl friednd Anne Fu (04/10/74) [phone])   Professional Supports: Case Manager/Social Birdie Sons is [name]'s OBCM)   Employment: Unemployed   Type of Work:     Education:      Homebound arranged:    Museum/gallery curator Resources:  Medicaid   Other Resources:      Cultural/Religious Considerations Which May Impact Care:    Strengths:  ([name] has a strong support team.)   Psychotropic Medications:         Pediatrician:       Pediatrician List:   Pharmacist, community      Pediatrician Fax Number:    Risk Factors/Current Problems:  Substance Use    Cognitive State:  Distractible , Poor Insight ([name] appeared high possibly off an illict substance. )   Mood/Affect:  Other (Comment), Calm ,  Comfortable , Relaxed ([name] was droswy and unable to keep her eyes open. )   CSW Assessment: CSW met with [name] in room 147 to complete an assessment for SA hx.  When CSW arrived, [name] was asleep with infant on her chest engaged in skin to skin.  [name]'s guest Anne Fu was present and was observing infant and [name] sleeping. There were 2 children in the room that Katharine Look identified as [name]'s daughter (whom she does not have custody of) and Sandra's grandson. During the assessment [name] was extremely drowsy and would often fall asleep while speaking with CSW. CSW would call [name]'s name and [name] would open her eyes for brief moments before falling back to sleep.   CSW expressed concerns regarding [name]'s ability to care for infant alone and requested that when Kandice Moos [name]'s room to call [name]'s nurse to have infant placed in the nursery (CSW expressed concerns with bedside nurse after the assessment and bedside nurse agreed with plan).  CSW whispered the hospital's policy (attempted to inform discretely due to the present of the children) to [name]. [name] kept falling asleep and CSW had to consistently awake [name]. [name] stayed awake  long enough that CSW was able to inform [name] that CSW was making a report to [facility] CPS.  [name] shared that the only substance that [name] has utilized was cocaine and reported [name]'s last use was the day of infant's delivery.   Per [name], [name] has a bed reserved at [facility] and [name] expressed a desire to attend the program with her infant.   CSW made a report to [place] worker, Malachy Chamber.  CPS will follow-up with CSW within 24 hours.   CSW Plan/Description:  Child Copy Report , CSW Awaiting CPS Disposition Plan, CSW Will Continue to Monitor Umbilical Cord Tissue Drug Screen Results and Make Report if [facility], [place], MSW, LCSW Clinical Social Work [phone]   Dimple Nanas,  LCSW 08/13/2018, 1:27 PM

## 2018-08-14 LAB — HCV INTERPRETATION

## 2018-08-14 LAB — HCV AB W REFLEX TO QUANT PCR: HCV Ab: <0.1 s/co ratio (ref 0.0–0.9)

## 2018-08-14 LAB — RPR: RPR Ser Ql: NONREACTIVE

## 2018-08-14 MED ORDER — AMLODIPINE BESYLATE 10 MG PO TABS: 10.0000 mg | ORAL_TABLET | Freq: Every day | ORAL | 0 refills | Status: DC

## 2018-08-14 MED ORDER — IBUPROFEN 600 MG PO TABS: 600.0000 mg | ORAL_TABLET | Freq: Four times a day (QID) | ORAL | 0 refills | Status: DC

## 2018-08-14 MED ORDER — AMLODIPINE BESYLATE 10 MG PO TABS
10.0000 mg | ORAL_TABLET | Freq: Every day | ORAL | Status: DC
  Administered 2018-08-14: 10 mg via ORAL
  Filled 2018-08-14 (×2): qty 1

## 2018-08-14 NOTE — Discharge Instructions (Signed)

## 2018-08-14 NOTE — Discharge Summary (Addendum)
Postpartum Discharge Summary     Patient Name: Penny Clark DOB: 11/07/1990 MRN: [id number]  Date of admission: 08/12/2018 Delivering Provider:     Date of discharge: 08/14/2018  Admitting diagnosis: PT DELIVERED Intrauterine pregnancy: [redacted]w[redacted]d     Secondary diagnosis:  Active Problems:   SVD (spontaneous vaginal delivery)  Additional problems: delivery at home, substance use (cocaine use day of delivery), tobacco use, THC use, anxiety/depression, GBS positive      Discharge diagnosis: Term Pregnancy Delivered                                                                                                Post partum procedures:NONE  Augmentation: None, home delivery  Complications: None  Hospital course:  Onset of Labor With Vaginal Delivery     27 y.o. yo G2P2002 at [redacted]w[redacted]d was admitted in after delivery at home on 08/12/2018. Patient had an uncomplicated labor course as follows:  Membrane Rupture Time/Date: 6:00 AM ,08/12/2018   Intrapartum Procedures: Episiotomy: None [1]                                         Lacerations:  None [1]  Patient had a delivery of a Viable infant. 08/12/2018  Information for the patient's newborn:  Corryn, [place] Girl Saphire [[id number]]  Delivery Method: Vaginal, Spontaneous(Filed from Delivery Summary)    Pateint had an uncomplicated postpartum course.  She is ambulating, tolerating a regular diet, passing flatus, and urinating well. Patient is discharged home in stable condition on 08/14/18. BPs elevated after delivery. Patient started on Norvasc 5 mg, increased to 10 mg prior to d/c.    Magnesium Sulfate recieved: No BMZ received: No  Physical exam  Vitals:   08/13/18 1556 08/13/18 1854 08/14/18 0100 08/14/18 0656  BP: (!) 143/91 138/90 (!) 141/98 (!) 137/97  Pulse:  81 77 84  Resp:   18   Temp:    97.9 F (36.6 C)  TempSrc:    Oral  SpO2:   99% 98%   General: cooperative and no distress Lochia: appropriate Uterine Fundus: firm Incision:  N/A DVT Evaluation: No evidence of DVT seen on physical exam. No cords or calf tenderness. No significant calf/ankle edema. Labs: Lab Results  Component Value Date   WBC 10.3 08/13/2018   HGB 11.2 (L) 08/13/2018   HCT 34.6 (L) 08/13/2018   MCV 84.0 08/13/2018   PLT 195 08/13/2018   CMP Latest Ref Rng & Units 08/13/2018  Glucose 70 - 99 mg/dL 88  BUN 6 - 20 mg/dL 8  Creatinine 7.82 - 9.56 mg/dL 2.13  Sodium 086 - 578 mmol/L 137  Potassium 3.5 - 5.1 mmol/L 3.9  Chloride 98 - 111 mmol/L 107  CO2 22 - 32 mmol/L 23  Calcium 8.9 - 10.3 mg/dL 8.3(L)  Total Protein 6.5 - 8.1 g/dL 4.6(N)  Total Bilirubin 0.3 - 1.2 mg/dL 6.2(X)  Alkaline Phos 38 - 126 U/L 133(H)  AST 15 - 41 U/L 22  ALT 0 - 44 U/L  13    Discharge instruction: per After Visit Summary and "Baby and Me Booklet".  After visit meds:  Allergies as of 08/14/2018   No Known Allergies     Medication List    STOP taking these medications   metroNIDAZOLE 500 MG tablet Commonly known as:  FLAGYL     TAKE these medications   amLODipine 10 MG tablet Commonly known as:  NORVASC Take 1 tablet (10 mg total) by mouth daily.   buPROPion 150 MG 24 hr tablet Commonly known as:  WELLBUTRIN XL Take 1 tablet (150 mg total) by mouth daily.   CITRANATAL 90 DHA 90-1 & 300 MG Misc Take 1 tablet by mouth daily.   ibuprofen 600 MG tablet Commonly known as:  ADVIL,MOTRIN Take 1 tablet (600 mg total) by mouth every 6 (six) hours.       Diet: routine diet  Activity: Advance as tolerated. Pelvic rest for 6 weeks.   Outpatient follow up:1 week Follow up Appt: Future Appointments  Date Time Provider Department Center  09/10/2018 11:00 AM Constant, Gigi Gin, MD [facility] None   Follow up Visit:No follow-ups on file.   Please schedule this patient for Postpartum visit in: 1 week with the following provider: MD For C/S patients schedule nurse incision check in weeks 2 weeks: no High risk pregnancy complicated by: HTN Delivery  mode:  SVD Anticipated Birth Control:  other/unsure PP Procedures needed: BP check  Schedule Integrated BH visit: no      Newborn Data: Live born female  Birth Weight: 5 lb 15.4 oz (2705 g) APGAR: ,   Newborn Delivery   Birth date/time:  08/12/2018 06:00:00 Delivery type:  Vaginal, Spontaneous     Baby Feeding: Breast Disposition:pending CPS due to maternal cocaine use prior to delivery and complicated social situation with home delivery    08/14/2018 Oralia Manis, DO PGY-2  OB FELLOW DISCHARGE ATTESTATION  I have seen and examined this patient and agree with above documentation in the resident's note.   Marcy Siren, D.O. OB Fellow  08/14/2018, 10:02 AM

## 2018-08-14 NOTE — Progress Notes (Signed)
Set pt up with Babyscripts My Journey app and BP cuff.  Instructed on use and to take BP twice a day.   First BP reading taken and successfully sent to app.  Pt acknowledged instructions and had no further questions at this time.

## 2018-08-14 NOTE — Progress Notes (Signed)
CSW spoke with CPS worker Alexis Robertson via telephone.  CPS informed CSW that infant will discharge to [name]'s mother (MGM) Leigh Micek, when infant is medically ready (a letter from CPS is placed in infant's chart).  MGM will need to provide a government issued ID and a copy should be placed in infant's chart.   CPS will continue to provide resources and supports to the family after discharges.    [name] and infant has a confirmed bed at [facility] on 08/19/18.  There are no barriers to [name]'s discharge but there are barriers to infant discharging to [name].   Gabreille Dardis Boyd-Gilyard, MSW, LCSW Clinical Social Work [phone] 

## 2018-08-16 ENCOUNTER — Inpatient Hospital Stay (HOSPITAL_COMMUNITY): Admission: RE | Admit: 2018-08-16 | Payer: Medicaid Other | Source: Ambulatory Visit

## 2018-08-21 ENCOUNTER — Institutional Professional Consult (permissible substitution): Payer: Medicaid Other

## 2018-09-10 ENCOUNTER — Ambulatory Visit: Payer: Medicaid Other | Admitting: Obstetrics and Gynecology

## 2019-09-02 ENCOUNTER — Ambulatory Visit (INDEPENDENT_AMBULATORY_CARE_PROVIDER_SITE_OTHER): Payer: Medicaid Other

## 2019-09-02 ENCOUNTER — Other Ambulatory Visit: Payer: Self-pay

## 2019-09-02 DIAGNOSIS — Z3201 Encounter for pregnancy test, result positive: Secondary | ICD-10-CM

## 2019-09-02 DIAGNOSIS — Z3480 Encounter for supervision of other normal pregnancy, unspecified trimester: Secondary | ICD-10-CM

## 2019-09-02 LAB — POCT URINE PREGNANCY: Preg Test, Ur: POSITIVE — AB

## 2019-09-02 MED ORDER — BLOOD PRESSURE KIT: PACK | 0 refills | Status: AC

## 2019-09-02 MED ORDER — CITRANATAL 90 DHA 90-1 & 300 MG PO MISC: 1.0000 | Freq: Every day | ORAL | 11 refills | Status: AC

## 2019-09-02 MED ORDER — BLOOD PRESSURE MONITOR/L CUFF MISC: 0 refills | Status: AC

## 2019-09-02 NOTE — Progress Notes (Signed)
  Virtual Visit via Telephone Note  I connected with Penny Clark on 09/02/19 at  9:30 AM EDT by telephone and verified that I am speaking with the correct person using two identifiers.  Location: [facility] Patient: Penny Clark   I discussed the limitations, risks, security and privacy concerns of performing an evaluation and management service by telephone and the availability of in person appointments. I also discussed with the patient that there may be a patient responsible charge related to this service. The patient expressed understanding and agreed to proceed.   History of Present Illness: PRENATAL INTAKE SUMMARY  Penny Clark presents today New OB Nurse Interview.  OB History    Gravida  3   Para  2   Term  2   Preterm      AB      Living  2     SAB      TAB      Ectopic      Multiple  0   Live Births  2          I have reviewed the patient's medical, obstetrical, social, and family histories, medications, and available lab results.  SUBJECTIVE She has no unusual complaints   Observations/Objective: Initial nurse interview for history/labs (New OB)  EDD: 04/22/20 GA: [redacted]w[redacted]d GP: G3 P2  GENERAL APPEARANCE: alert, well appearing  Assessment and Plan: Normal pregnancy  Follow Up Instructions:   I discussed the assessment and treatment plan with the patient. The patient was provided an opportunity to ask questions and all were answered. The patient agreed with the plan and demonstrated an understanding of the instructions.   The patient was advised to call back or seek an in-person evaluation if the symptoms worsen or if the condition fails to improve as anticipated.  I provided 15 minutes of non-face-to-face time during this encounter.   Delrae Alfred, CMA

## 2019-09-02 NOTE — Assessment & Plan Note (Addendum)
..   Nursing Staff Provider  Office Location  [facility] Dating    Language  English Anatomy US    Flu Vaccine  Wanting during next office visit Genetic Screen  NIPS:   AFP:   First Screen:  Quad:    TDaP vaccine    Hgb A1C or  GTT Early  Third trimester   Rhogam     LAB RESULTS   Feeding Plan Breast Blood Type     Contraception Nexplanon? Antibody    Circumcision Yes if BOY Rubella    Pediatrician  [facility]     Support Person FOB HBsAg     Prenatal Classes  HIV    BTL Consent  GBS  (For PCN allergy, check sensitivities)   VBAC Consent  Pap     Hgb Electro    BP Cuff Ordered 09/02/19 CF     SMA     Waterbirth  [ ]  Class [ ]  Consent [ ]  CNM visit

## 2019-09-02 NOTE — Addendum Note (Signed)
Addended by: Delrae Alfred on: 09/02/2019 11:28 AM   Modules accepted: Orders

## 2019-09-15 ENCOUNTER — Telehealth (INDEPENDENT_AMBULATORY_CARE_PROVIDER_SITE_OTHER): Payer: Medicaid Other

## 2019-09-15 DIAGNOSIS — Z3480 Encounter for supervision of other normal pregnancy, unspecified trimester: Secondary | ICD-10-CM

## 2019-09-15 NOTE — Progress Notes (Addendum)
I contacted patient via phone on 09/15/19 at  1:15 PM EST. Two identifiers were used to insure that I was speaking with the correct person. LMP wsa approx. 07/17/19. Leaving patient at 8 weeks 4 days. she is considering the nexplanon after delivery. She didn't have any complaints. Bp monitor and cuff was order. Also babyscripts was sent through email. She is set to f/u on 10/07/19. -EH/RMA

## 2019-09-21 NOTE — Progress Notes (Signed)
I have reviewed this chart and agree with the RN/CMA assessment and management.    K. Meryl Jayleen Scaglione, M.D. Attending [facility] (Faculty Practice)   

## 2019-10-07 ENCOUNTER — Other Ambulatory Visit (HOSPITAL_COMMUNITY)
Admission: RE | Admit: 2019-10-07 | Discharge: 2019-10-07 | Disposition: A | Discharge status: Home / Self Care | Payer: Medicaid Other | Source: Ambulatory Visit | Attending: Obstetrics and Gynecology | Admitting: Obstetrics and Gynecology

## 2019-10-07 ENCOUNTER — Ambulatory Visit (INDEPENDENT_AMBULATORY_CARE_PROVIDER_SITE_OTHER): Payer: Medicaid Other | Admitting: Obstetrics and Gynecology

## 2019-10-07 ENCOUNTER — Other Ambulatory Visit: Payer: Self-pay

## 2019-10-07 ENCOUNTER — Encounter: Payer: Self-pay | Admitting: Obstetrics and Gynecology

## 2019-10-07 VITALS — BP 120/77 | HR 82 | Wt 214.3 lb

## 2019-10-07 DIAGNOSIS — Z23 Encounter for immunization: Secondary | ICD-10-CM

## 2019-10-07 DIAGNOSIS — Z3481 Encounter for supervision of other normal pregnancy, first trimester: Secondary | ICD-10-CM

## 2019-10-07 DIAGNOSIS — F1910 Other psychoactive substance abuse, uncomplicated: Secondary | ICD-10-CM

## 2019-10-07 DIAGNOSIS — F329 Major depressive disorder, single episode, unspecified: Secondary | ICD-10-CM

## 2019-10-07 DIAGNOSIS — Z3480 Encounter for supervision of other normal pregnancy, unspecified trimester: Secondary | ICD-10-CM | POA: Diagnosis not present

## 2019-10-07 DIAGNOSIS — Z3A11 11 weeks gestation of pregnancy: Secondary | ICD-10-CM

## 2019-10-07 DIAGNOSIS — F32A Depression, unspecified: Secondary | ICD-10-CM

## 2019-10-07 DIAGNOSIS — F419 Anxiety disorder, unspecified: Secondary | ICD-10-CM

## 2019-10-07 DIAGNOSIS — O99341 Other mental disorders complicating pregnancy, first trimester: Secondary | ICD-10-CM

## 2019-10-07 DIAGNOSIS — O99321 Drug use complicating pregnancy, first trimester: Secondary | ICD-10-CM

## 2019-10-07 NOTE — Addendum Note (Signed)
Addended byCleotilde Neer on: 10/07/2019 02:02 PM   Modules accepted: Orders

## 2019-10-07 NOTE — Progress Notes (Signed)
   Subjective:    Penny Clark is a [id number] [redacted]w[redacted]d being seen today for her first obstetrical visit.  Her obstetrical history is significant for history of substance abuse, recently completed rehab for cocaine with [facility], short interval between pregnancies with last SVD in 08/2018. Patient admits to being sober for over a year. She denies any symptoms associated with depression/anxiety. Patient does intend to breast feed. Pregnancy history fully reviewed.  Patient reports no complaints.  Vitals:   10/07/19 1316  BP: 120/77  Pulse: 82  Weight: 214 lb 4.8 oz (97.2 kg)    HISTORY: OB History  Gravida Para Term Preterm AB Living  3 2 2     2   SAB TAB Ectopic Multiple Live Births        0 2    # Outcome Date GA Lbr Len/2nd Weight Sex Delivery Anes PTL Lv  3 Current           2 Term 08/12/18 [redacted]w[redacted]d  5 lb 15.4 oz (2.705 kg) F Vag-Spont None  LIV  1 Term 07/14/13 [redacted]w[redacted]d 07:48 / 02:05 8 lb 8.2 oz (3.861 kg) F Vag-Spont EPI  LIV   Past Medical History:  Diagnosis Date  . Anemia   . Breastfeeding (infant) 07/16/2013  . Cocaine abuse ([place])   . Normal delivery 07/16/2013   Past Surgical History:  Procedure Laterality Date  . NO PAST SURGERIES     Family History  Problem Relation Age of Onset  . Cancer Maternal Grandfather      Exam    Uterus:     Pelvic Exam:    Perineum: No Hemorrhoids, Normal Perineum   Vulva: normal   Vagina:  normal mucosa, normal discharge   pH:    Cervix: multiparous appearance   Adnexa: normal adnexa and no mass, fullness, tenderness   Bony Pelvis: gynecoid  System: Breast:  normal appearance, no masses or tenderness   Skin: normal coloration and turgor, no rashes    Neurologic: oriented, no focal deficits   Extremities: normal strength, tone, and muscle mass   HEENT extra ocular movement intact   Mouth/Teeth mucous membranes moist, pharynx normal without lesions and dental hygiene good   Neck supple and no masses   Cardiovascular: regular  rate and rhythm   Respiratory:  appears well, vitals normal, no respiratory distress, acyanotic, normal RR, chest clear, no wheezing, crepitations, rhonchi, normal symmetric air entry   Abdomen: soft, non-tender; bowel sounds normal; no masses,  no organomegaly   Urinary:       Assessment:    Pregnancy: [id number] Patient Active Problem List   Diagnosis Date Noted  . Supervision of other normal pregnancy, antepartum 09/02/2019  . SVD (spontaneous vaginal delivery) 08/12/2018  . Cocaine use complicating pregnancy 97/12/6376  . Mild tobacco abuse 02/24/2018  . Substance abuse ([place]) 12/19/2017  . Anxiety and depression 07/09/2014        Plan:     Initial labs drawn. Prenatal vitamins. Problem list reviewed and updated. Genetic Screening discussed : panorama ordered.  Ultrasound discussed; fetal survey: ordered.  Follow up in 4 weeks. 50% of 30 min visit spent on counseling and coordination of care.     Makeda Peeks 10/07/2019

## 2019-10-07 NOTE — Progress Notes (Signed)
Pt is here for initial OB visit. LMP 07/17/2019. Last pap 01/16/2018

## 2019-10-07 NOTE — Patient Instructions (Signed)
° °First Trimester of Pregnancy °The first trimester of pregnancy is from week 1 until the end of week 13 (months 1 through 3). A week after a sperm fertilizes an egg, the egg will implant on the wall of the uterus. This embryo will begin to develop into a baby. Genes from you and your partner will form the baby. The female genes will determine whether the baby will be a boy or a girl. At 6-8 weeks, the eyes and face will be formed, and the heartbeat can be seen on ultrasound. At the end of 12 weeks, all the baby's organs will be formed. °Now that you are pregnant, you will want to do everything you can to have a healthy baby. Two of the most important things are to get good prenatal care and to follow your health care provider's instructions. Prenatal care is all the medical care you receive before the baby's birth. This care will help prevent, find, and treat any problems during the pregnancy and childbirth. °Body changes during your first trimester °Your body goes through many changes during pregnancy. The changes vary from woman to woman. °· You may gain or lose a couple of pounds at first. °· You may feel sick to your stomach (nauseous) and you may throw up (vomit). If the vomiting is uncontrollable, call your health care provider. °· You may tire easily. °· You may develop headaches that can be relieved by medicines. All medicines should be approved by your health care provider. °· You may urinate more often. Painful urination may mean you have a bladder infection. °· You may develop heartburn as a result of your pregnancy. °· You may develop constipation because certain hormones are causing the muscles that push stool through your intestines to slow down. °· You may develop hemorrhoids or swollen veins (varicose veins). °· Your breasts may begin to grow larger and become tender. Your nipples may stick out more, and the tissue that surrounds them (areola) may become darker. °· Your gums may bleed and may be  sensitive to brushing and flossing. °· Dark spots or blotches (chloasma, mask of pregnancy) may develop on your face. This will likely fade after the baby is born. °· Your menstrual periods will stop. °· You may have a loss of appetite. °· You may develop cravings for certain kinds of food. °· You may have changes in your emotions from day to day, such as being excited to be pregnant or being concerned that something may go wrong with the pregnancy and baby. °· You may have more vivid and strange dreams. °· You may have changes in your hair. These can include thickening of your hair, rapid growth, and changes in texture. Some women also have hair loss during or after pregnancy, or hair that feels dry or thin. Your hair will most likely return to normal after your baby is born. °What to expect at prenatal visits °During a routine prenatal visit: °· You will be weighed to make sure you and the baby are growing normally. °· Your blood pressure will be taken. °· Your abdomen will be measured to track your baby's growth. °· The fetal heartbeat will be listened to between weeks 10 and 14 of your pregnancy. °· Test results from any previous visits will be discussed. °Your health care provider may ask you: °· How you are feeling. °· If you are feeling the baby move. °· If you have had any abnormal symptoms, such as leaking fluid, bleeding, severe headaches, or   abdominal cramping. °· If you are using any tobacco products, including cigarettes, chewing tobacco, and electronic cigarettes. °· If you have any questions. °Other tests that may be performed during your first trimester include: °· Blood tests to find your blood type and to check for the presence of any previous infections. The tests will also be used to check for low iron levels (anemia) and protein on red blood cells (Rh antibodies). Depending on your risk factors, or if you previously had diabetes during pregnancy, you may have tests to check for high blood sugar  that affects pregnant women (gestational diabetes). °· Urine tests to check for infections, diabetes, or protein in the urine. °· An ultrasound to confirm the proper growth and development of the baby. °· Fetal screens for spinal cord problems (spina bifida) and Down syndrome. °· HIV (human immunodeficiency virus) testing. Routine prenatal testing includes screening for HIV, unless you choose not to have this test. °· You may need other tests to make sure you and the baby are doing well. °Follow these instructions at home: °Medicines °· Follow your health care provider's instructions regarding medicine use. Specific medicines may be either safe or unsafe to take during pregnancy. °· Take a prenatal vitamin that contains at least 600 micrograms (mcg) of folic acid. °· If you develop constipation, try taking a stool softener if your health care provider approves. °Eating and drinking ° °· Eat a balanced diet that includes fresh fruits and vegetables, whole grains, good sources of protein such as meat, eggs, or tofu, and low-fat dairy. Your health care provider will help you determine the amount of weight gain that is right for you. °· Avoid raw meat and uncooked cheese. These carry germs that can cause birth defects in the baby. °· Eating four or five small meals rather than three large meals a day may help relieve nausea and vomiting. If you start to feel nauseous, eating a few soda crackers can be helpful. Drinking liquids between meals, instead of during meals, also seems to help ease nausea and vomiting. °· Limit foods that are high in fat and processed sugars, such as fried and sweet foods. °· To prevent constipation: °? Eat foods that are high in fiber, such as fresh fruits and vegetables, whole grains, and beans. °? Drink enough fluid to keep your urine clear or pale yellow. °Activity °· Exercise only as directed by your health care provider. Most women can continue their usual exercise routine during  pregnancy. Try to exercise for 30 minutes at least 5 days a week. Exercising will help you: °? Control your weight. °? Stay in shape. °? Be prepared for labor and delivery. °· Experiencing pain or cramping in the lower abdomen or lower back is a good sign that you should stop exercising. Check with your health care provider before continuing with normal exercises. °· Try to avoid standing for long periods of time. Move your legs often if you must stand in one place for a long time. °· Avoid heavy lifting. °· Wear low-heeled shoes and practice good posture. °· You may continue to have sex unless your health care provider tells you not to. °Relieving pain and discomfort °· Wear a good support bra to relieve breast tenderness. °· Take warm sitz baths to soothe any pain or discomfort caused by hemorrhoids. Use hemorrhoid cream if your health care provider approves. °· Rest with your legs elevated if you have leg cramps or low back pain. °· If you develop varicose veins   in your legs, wear support hose. Elevate your feet for 15 minutes, 3-4 times a day. Limit salt in your diet. Prenatal care  Schedule your prenatal visits by the twelfth week of pregnancy. They are usually scheduled monthly at first, then more often in the last 2 months before delivery.  Write down your questions. Take them to your prenatal visits.  Keep all your prenatal visits as told by your health care provider. This is important. Safety  Wear your seat belt at all times when driving.  Make a list of emergency phone numbers, including numbers for family, friends, the hospital, and police and fire departments. General instructions  Ask your health care provider for a referral to a local prenatal education class. Begin classes no later than the beginning of month 6 of your pregnancy.  Ask for help if you have counseling or nutritional needs during pregnancy. Your health care provider can offer advice or refer you to specialists for help  with various needs.  Do not use hot tubs, steam rooms, or saunas.  Do not douche or use tampons or scented sanitary pads.  Do not cross your legs for long periods of time.  Avoid cat litter boxes and soil used by cats. These carry germs that can cause birth defects in the baby and possibly loss of the fetus by miscarriage or stillbirth.  Avoid all smoking, herbs, alcohol, and medicines not prescribed by your health care provider. Chemicals in these products affect the formation and growth of the baby.  Do not use any products that contain nicotine or tobacco, such as cigarettes and e-cigarettes. If you need help quitting, ask your health care provider. You may receive counseling support and other resources to help you quit.  Schedule a dentist appointment. At home, brush your teeth with a soft toothbrush and be gentle when you floss. Contact a health care provider if:  You have dizziness.  You have mild pelvic cramps, pelvic pressure, or nagging pain in the abdominal area.  You have persistent nausea, vomiting, or diarrhea.  You have a bad smelling vaginal discharge.  You have pain when you urinate.  You notice increased swelling in your face, hands, legs, or ankles.  You are exposed to fifth disease or chickenpox.  You are exposed to [place] measles (rubella) and have never had it. Get help right away if:  You have a fever.  You are leaking fluid from your vagina.  You have spotting or bleeding from your vagina.  You have severe abdominal cramping or pain.  You have rapid weight gain or loss.  You vomit blood or material that looks like coffee grounds.  You develop a severe headache.  You have shortness of breath.  You have any kind of trauma, such as from a fall or a car accident. Summary  The first trimester of pregnancy is from week 1 until the end of week 13 (months 1 through 3).  Your body goes through many changes during pregnancy. The changes vary from  woman to woman.  You will have routine prenatal visits. During those visits, your health care provider will examine you, discuss any test results you may have, and talk with you about how you are feeling. This information is not intended to replace advice given to you by your health care provider. Make sure you discuss any questions you have with your health care provider. Document Released: 10/16/2001 Document Revised: 10/04/2017 Document Reviewed: 10/03/2016 Elsevier Patient Education  2020 [employer].  Second Trimester of Pregnancy °The second trimester is from week 14 through week 27 (months 4 through 6). The second trimester is often a time when you feel your best. Your body has adjusted to being pregnant, and you begin to feel better physically. Usually, morning sickness has lessened or quit completely, you may have more energy, and you may have an increase in appetite. The second trimester is also a time when the fetus is growing rapidly. At the end of the sixth month, the fetus is about 9 inches long and weighs about 1½ pounds. You will likely begin to feel the baby move (quickening) between 16 and 20 weeks of pregnancy. °Body changes during your second trimester °Your body continues to go through many changes during your second trimester. The changes vary from woman to woman. °· Your weight will continue to increase. You will notice your lower abdomen bulging out. °· You may begin to get stretch marks on your hips, abdomen, and breasts. °· You may develop headaches that can be relieved by medicines. The medicines should be approved by your health care provider. °· You may urinate more often because the fetus is pressing on your bladder. °· You may develop or continue to have heartburn as a result of your pregnancy. °· You may develop constipation because certain hormones are causing the muscles that push waste through your intestines to slow down. °· You may develop hemorrhoids or swollen,  bulging veins (varicose veins). °· You may have back pain. This is caused by: °? Weight gain. °? Pregnancy hormones that are relaxing the joints in your pelvis. °? A shift in weight and the muscles that support your balance. °· Your breasts will continue to grow and they will continue to become tender. °· Your gums may bleed and may be sensitive to brushing and flossing. °· Dark spots or blotches (chloasma, mask of pregnancy) may develop on your face. This will likely fade after the baby is born. °· A dark line from your belly button to the pubic area (linea nigra) may appear. This will likely fade after the baby is born. °· You may have changes in your hair. These can include thickening of your hair, rapid growth, and changes in texture. Some women also have hair loss during or after pregnancy, or hair that feels dry or thin. Your hair will most likely return to normal after your baby is born. °What to expect at prenatal visits °During a routine prenatal visit: °· You will be weighed to make sure you and the fetus are growing normally. °· Your blood pressure will be taken. °· Your abdomen will be measured to track your baby's growth. °· The fetal heartbeat will be listened to. °· Any test results from the previous visit will be discussed. °Your health care provider may ask you: °· How you are feeling. °· If you are feeling the baby move. °· If you have had any abnormal symptoms, such as leaking fluid, bleeding, severe headaches, or abdominal cramping. °· If you are using any tobacco products, including cigarettes, chewing tobacco, and electronic cigarettes. °· If you have any questions. °Other tests that may be performed during your second trimester include: °· Blood tests that check for: °? Low iron levels (anemia). °? High blood sugar that affects pregnant women (gestational diabetes) between 24 and 28 weeks. °? Rh antibodies. This is to check for a protein on red blood cells (Rh factor). °· Urine tests to check  for infections, diabetes, or protein in   the urine. °· An ultrasound to confirm the proper growth and development of the baby. °· An amniocentesis to check for possible genetic problems. °· Fetal screens for spina bifida and Down syndrome. °· HIV (human immunodeficiency virus) testing. Routine prenatal testing includes screening for HIV, unless you choose not to have this test. °Follow these instructions at home: °Medicines °· Follow your health care provider's instructions regarding medicine use. Specific medicines may be either safe or unsafe to take during pregnancy. °· Take a prenatal vitamin that contains at least 600 micrograms (mcg) of folic acid. °· If you develop constipation, try taking a stool softener if your health care provider approves. °Eating and drinking ° °· Eat a balanced diet that includes fresh fruits and vegetables, whole grains, good sources of protein such as meat, eggs, or tofu, and low-fat dairy. Your health care provider will help you determine the amount of weight gain that is right for you. °· Avoid raw meat and uncooked cheese. These carry germs that can cause birth defects in the baby. °· If you have low calcium intake from food, talk to your health care provider about whether you should take a daily calcium supplement. °· Limit foods that are high in fat and processed sugars, such as fried and sweet foods. °· To prevent constipation: °? Drink enough fluid to keep your urine clear or pale yellow. °? Eat foods that are high in fiber, such as fresh fruits and vegetables, whole grains, and beans. °Activity °· Exercise only as directed by your health care provider. Most women can continue their usual exercise routine during pregnancy. Try to exercise for 30 minutes at least 5 days a week. Stop exercising if you experience uterine contractions. °· Avoid heavy lifting, wear low heel shoes, and practice good posture. °· A sexual relationship may be continued unless your health care provider  directs you otherwise. °Relieving pain and discomfort °· Wear a good support bra to prevent discomfort from breast tenderness. °· Take warm sitz baths to soothe any pain or discomfort caused by hemorrhoids. Use hemorrhoid cream if your health care provider approves. °· Rest with your legs elevated if you have leg cramps or low back pain. °· If you develop varicose veins, wear support hose. Elevate your feet for 15 minutes, 3-4 times a day. Limit salt in your diet. °Prenatal Care °· Write down your questions. Take them to your prenatal visits. °· Keep all your prenatal visits as told by your health care provider. This is important. °Safety °· Wear your seat belt at all times when driving. °· Make a list of emergency phone numbers, including numbers for family, friends, the hospital, and police and fire departments. °General instructions °· Ask your health care provider for a referral to a local prenatal education class. Begin classes no later than the beginning of month 6 of your pregnancy. °· Ask for help if you have counseling or nutritional needs during pregnancy. Your health care provider can offer advice or refer you to specialists for help with various needs. °· Do not use hot tubs, steam rooms, or saunas. °· Do not douche or use tampons or scented sanitary pads. °· Do not cross your legs for long periods of time. °· Avoid cat litter boxes and soil used by cats. These carry germs that can cause birth defects in the baby and possibly loss of the fetus by miscarriage or stillbirth. °· Avoid all smoking, herbs, alcohol, and unprescribed drugs. Chemicals in these products can affect the formation   and growth of the baby.  Do not use any products that contain nicotine or tobacco, such as cigarettes and e-cigarettes. If you need help quitting, ask your health care provider.  Visit your dentist if you have not gone yet during your pregnancy. Use a soft toothbrush to brush your teeth and be gentle when you  floss. Contact a health care provider if:  You have dizziness.  You have mild pelvic cramps, pelvic pressure, or nagging pain in the abdominal area.  You have persistent nausea, vomiting, or diarrhea.  You have a bad smelling vaginal discharge.  You have pain when you urinate. Get help right away if:  You have a fever.  You are leaking fluid from your vagina.  You have spotting or bleeding from your vagina.  You have severe abdominal cramping or pain.  You have rapid weight gain or weight loss.  You have shortness of breath with chest pain.  You notice sudden or extreme swelling of your face, hands, ankles, feet, or legs.  You have not felt your baby move in over an hour.  You have severe headaches that do not go away when you take medicine.  You have vision changes. Summary  The second trimester is from week 14 through week 27 (months 4 through 6). It is also a time when the fetus is growing rapidly.  Your body goes through many changes during pregnancy. The changes vary from woman to woman.  Avoid all smoking, herbs, alcohol, and unprescribed drugs. These chemicals affect the formation and growth your baby.  Do not use any tobacco products, such as cigarettes, chewing tobacco, and e-cigarettes. If you need help quitting, ask your health care provider.  Contact your health care provider if you have any questions. Keep all prenatal visits as told by your health care provider. This is important. This information is not intended to replace advice given to you by your health care provider. Make sure you discuss any questions you have with your health care provider. Document Released: 10/16/2001 Document Revised: 02/13/2019 Document Reviewed: 11/27/2016 Elsevier Patient Education  2020 [employer].   Contraception Choices Contraception, also called birth control, refers to methods or devices that prevent pregnancy. Hormonal methods Contraceptive implant  A  contraceptive implant is a thin, plastic tube that contains a hormone. It is inserted into the upper part of the arm. It can remain in place for up to 3 years. Progestin-only injections Progestin-only injections are injections of progestin, a synthetic form of the hormone progesterone. They are given every 3 months by a health care provider. Birth control pills  Birth control pills are pills that contain hormones that prevent pregnancy. They must be taken once a day, preferably at the same time each day. Birth control patch  The birth control patch contains hormones that prevent pregnancy. It is placed on the skin and must be changed once a week for three weeks and removed on the fourth week. A prescription is needed to use this method of contraception. Vaginal ring  A vaginal ring contains hormones that prevent pregnancy. It is placed in the vagina for three weeks and removed on the fourth week. After that, the process is repeated with a new ring. A prescription is needed to use this method of contraception. Emergency contraceptive Emergency contraceptives prevent pregnancy after unprotected sex. They come in pill form and can be taken up to 5 days after sex. They work best the sooner they are taken after having sex. Most emergency contraceptives  are available without a prescription. This method should not be used as your only form of birth control. °Barrier methods °Female condom ° °A female condom is a thin sheath that is worn over the penis during sex. Condoms keep sperm from going inside a woman's body. They can be used with a spermicide to increase their effectiveness. They should be disposed after a single use. °Female condom ° °A female condom is a soft, loose-fitting sheath that is put into the vagina before sex. The condom keeps sperm from going inside a woman's body. They should be disposed after a single use. °Diaphragm ° °A diaphragm is a soft, dome-shaped barrier. It is inserted into the  vagina before sex, along with a spermicide. The diaphragm blocks sperm from entering the uterus, and the spermicide kills sperm. A diaphragm should be left in the vagina for 6-8 hours after sex and removed within 24 hours. °A diaphragm is prescribed and fitted by a health care provider. A diaphragm should be replaced every 1-2 years, after giving birth, after gaining more than 15 lb (6.8 kg), and after pelvic surgery. °Cervical cap ° °A cervical cap is a round, soft latex or plastic cup that fits over the cervix. It is inserted into the vagina before sex, along with spermicide. It blocks sperm from entering the uterus. The cap should be left in place for 6-8 hours after sex and removed within 48 hours. A cervical cap must be prescribed and fitted by a health care provider. It should be replaced every 2 years. °Sponge ° °A sponge is a soft, circular piece of polyurethane foam with spermicide on it. The sponge helps block sperm from entering the uterus, and the spermicide kills sperm. To use it, you make it wet and then insert it into the vagina. It should be inserted before sex, left in for at least 6 hours after sex, and removed and thrown away within 30 hours. °Spermicides °Spermicides are chemicals that kill or block sperm from entering the cervix and uterus. They can come as a cream, jelly, suppository, foam, or tablet. A spermicide should be inserted into the vagina with an applicator at least 10-15 minutes before sex to allow time for it to work. The process must be repeated every time you have sex. Spermicides do not require a prescription. °Intrauterine contraception °Intrauterine device (IUD) °An IUD is a T-shaped device that is put in a woman's uterus. There are two types: °· Hormone IUD.This type contains progestin, a synthetic form of the hormone progesterone. This type can stay in place for 3-5 years. °· Copper IUD.This type is wrapped in copper wire. It can stay in place for 10 years. ° °Permanent  methods of contraception °Female tubal ligation °In this method, a woman's fallopian tubes are sealed, tied, or blocked during surgery to prevent eggs from traveling to the uterus. °Hysteroscopic sterilization °In this method, a small, flexible insert is placed into each fallopian tube. The inserts cause scar tissue to form in the fallopian tubes and block them, so sperm cannot reach an egg. The procedure takes about 3 months to be effective. Another form of birth control must be used during those 3 months. °Female sterilization °This is a procedure to tie off the tubes that carry sperm (vasectomy). After the procedure, the man can still ejaculate fluid (semen). °Natural planning methods °Natural family planning °In this method, a couple does not have sex on days when the woman could become pregnant. °Calendar method °This means keeping track of   the length of each menstrual cycle, identifying the days when pregnancy can happen, and not having sex on those days. Ovulation method In this method, a couple avoids sex during ovulation. Symptothermal method This method involves not having sex during ovulation. The woman typically checks for ovulation by watching changes in her temperature and in the consistency of cervical mucus. Post-ovulation method In this method, a couple waits to have sex until after ovulation. Summary  Contraception, also called birth control, means methods or devices that prevent pregnancy.  Hormonal methods of contraception include implants, injections, pills, patches, vaginal rings, and emergency contraceptives.  Barrier methods of contraception can include female condoms, female condoms, diaphragms, cervical caps, sponges, and spermicides.  There are two types of IUDs (intrauterine devices). An IUD can be put in a woman's uterus to prevent pregnancy for 3-5 years.  Permanent sterilization can be done through a procedure for males, females, or both.  Natural family planning methods  involve not having sex on days when the woman could become pregnant. This information is not intended to replace advice given to you by your health care provider. Make sure you discuss any questions you have with your health care provider. Document Released: 10/22/2005 Document Revised: 10/24/2017 Document Reviewed: 11/24/2016 Elsevier Patient Education  2020 [employer]   Postpartum Baby Blues The postpartum period begins right after the birth of a baby. During this time, there is often a lot of joy and excitement. It is also a time of many changes in the life of the parents. No matter how many times a mother gives birth, each child brings new challenges to the family, including different ways of relating to one another. It is common to have feelings of excitement along with confusing changes in moods, emotions, and thoughts. You may feel happy one minute and sad or stressed the next. These feelings of sadness usually happen in the period right after you have your baby, and they go away within a week or two. This is called the "baby blues." What are the causes? There is no known cause of baby blues. It is likely caused by a combination of factors. However, changes in hormone levels after childbirth are believed to trigger some of the symptoms. Other factors that can play a role in these mood changes include:  Lack of sleep.  Stressful life events, such as poverty, caring for a loved one, or death of a loved one.  Genetics. What are the signs or symptoms? Symptoms of this condition include:  Brief changes in mood, such as going from extreme happiness to sadness.  Decreased concentration.  Difficulty sleeping.  Crying spells and tearfulness.  Loss of appetite.  Irritability.  Anxiety. If the symptoms of baby blues last for more than 2 weeks or become more severe, you may have postpartum depression. How is this diagnosed? This condition is diagnosed based on an evaluation of your  symptoms. There are no medical or lab tests that lead to a diagnosis, but there are various questionnaires that a health care provider may use to identify women with the baby blues or postpartum depression. How is this treated? Treatment is not needed for this condition. The baby blues usually go away on their own in 1-2 weeks. Social support is often all that is needed. You will be encouraged to get adequate sleep and rest. Follow these instructions at home: Lifestyle      Get as much rest as you can. Take a nap when the baby sleeps.  Exercise regularly as told by your health care provider. Some women find yoga and walking to be helpful.  Eat a balanced and nourishing diet. This includes plenty of fruits and vegetables, whole grains, and lean proteins.  Do little things that you enjoy. Have a cup of tea, take a bubble bath, read your favorite magazine, or listen to your favorite music.  Avoid alcohol.  Ask for help with household chores, cooking, grocery shopping, or running errands. Do not try to do everything yourself. Consider hiring a postpartum doula to help. This is a professional who specializes in providing support to new mothers.  Try not to make any major life changes during pregnancy or right after giving birth. This can add stress. General instructions  Talk to people close to you about how you are feeling. Get support from your partner, family members, friends, or other new moms. You may want to join a support group.  Find ways to cope with stress. This may include: ? Writing your thoughts and feelings in a journal. ? Spending time outside. ? Spending time with people who make you laugh.  Try to stay positive in how you think. Think about the things you are grateful for.  Take over-the-counter and prescription medicines only as told by your health care provider.  Let your health care provider know if you have any concerns.  Keep all postpartum visits as told by your  health care provider. This is important. Contact a health care provider if:  Your baby blues do not go away after 2 weeks. Get help right away if:  You have thoughts of taking your own life (suicidal thoughts).  You think you may harm the baby or other people.  You see or hear things that are not there (hallucinations). Summary  After giving birth, you may feel happy one minute and sad or stressed the next. Feelings of sadness that happen right after the baby is born and go away after a week or two are called the "baby blues."  You can manage the baby blues by getting enough rest, eating a healthy diet, exercising, spending time with supportive people, and finding ways to cope with stress.  If feelings of sadness and stress last longer than 2 weeks or get in the way of caring for your baby, talk to your health care provider. This may mean you have postpartum depression. This information is not intended to replace advice given to you by your health care provider. Make sure you discuss any questions you have with your health care provider. Document Released: 07/26/2004 Document Revised: 02/13/2019 Document Reviewed: 12/18/2016 Elsevier Patient Education  2020 [employer].   Breastfeeding  Choosing to breastfeed is one of the best decisions you can make for yourself and your baby. A change in hormones during pregnancy causes your breasts to make breast milk in your milk-producing glands. Hormones prevent breast milk from being released before your baby is born. They also prompt milk flow after birth. Once breastfeeding has begun, thoughts of your baby, as well as his or her sucking or crying, can stimulate the release of milk from your milk-producing glands. Benefits of breastfeeding Research shows that breastfeeding offers many health benefits for infants and mothers. It also offers a cost-free and convenient way to feed your baby. For your baby  Your first milk (colostrum) helps your  baby's digestive system to function better.  Special cells in your milk (antibodies) help your baby to fight off infections.  Breastfed babies are  less likely to develop asthma, allergies, obesity, or type 2 diabetes. They are also at lower risk for sudden infant death syndrome (SIDS).  Nutrients in breast milk are better able to meet your babys needs compared to infant formula.  Breast milk improves your baby's brain development. For you  Breastfeeding helps to create a very special bond between you and your baby.  Breastfeeding is convenient. Breast milk costs nothing and is always available at the correct temperature.  Breastfeeding helps to burn calories. It helps you to lose the weight that you gained during pregnancy.  Breastfeeding makes your uterus return faster to its size before pregnancy. It also slows bleeding (lochia) after you give birth.  Breastfeeding helps to lower your risk of developing type 2 diabetes, osteoporosis, rheumatoid arthritis, cardiovascular disease, and breast, ovarian, uterine, and endometrial cancer later in life. Breastfeeding basics Starting breastfeeding  Find a comfortable place to sit or lie down, with your neck and back well-supported.  Place a pillow or a rolled-up blanket under your baby to bring him or her to the level of your breast (if you are seated). Nursing pillows are specially designed to help support your arms and your baby while you breastfeed.  Make sure that your baby's tummy (abdomen) is facing your abdomen.  Gently massage your breast. With your fingertips, massage from the outer edges of your breast inward toward the nipple. This encourages milk flow. If your milk flows slowly, you may need to continue this action during the feeding.  Support your breast with 4 fingers underneath and your thumb above your nipple (make the letter "C" with your hand). Make sure your fingers are well away from your nipple and your babys  mouth.  Stroke your baby's lips gently with your finger or nipple.  When your baby's mouth is open wide enough, quickly bring your baby to your breast, placing your entire nipple and as much of the areola as possible into your baby's mouth. The areola is the colored area around your nipple. ? More areola should be visible above your baby's upper lip than below the lower lip. ? Your baby's lips should be opened and extended outward (flanged) to ensure an adequate, comfortable latch. ? Your baby's tongue should be between his or her lower gum and your breast.  Make sure that your baby's mouth is correctly positioned around your nipple (latched). Your baby's lips should create a seal on your breast and be turned out (everted).  It is common for your baby to suck about 2-3 minutes in order to start the flow of breast milk. Latching Teaching your baby how to latch onto your breast properly is very important. An improper latch can cause nipple pain, decreased milk supply, and poor weight gain in your baby. Also, if your baby is not latched onto your nipple properly, he or she may swallow some air during feeding. This can make your baby fussy. Burping your baby when you switch breasts during the feeding can help to get rid of the air. However, teaching your baby to latch on properly is still the best way to prevent fussiness from swallowing air while breastfeeding. Signs that your baby has successfully latched onto your nipple  Silent tugging or silent sucking, without causing you pain. Infant's lips should be extended outward (flanged).  Swallowing heard between every 3-4 sucks once your milk has started to flow (after your let-down milk reflex occurs).  Muscle movement above and in front of his or her ears  while sucking. Signs that your baby has not successfully latched onto your nipple  Sucking sounds or smacking sounds from your baby while breastfeeding.  Nipple pain. If you think your baby  has not latched on correctly, slip your finger into the corner of your babys mouth to break the suction and place it between your baby's gums. Attempt to start breastfeeding again. Signs of successful breastfeeding Signs from your baby  Your baby will gradually decrease the number of sucks or will completely stop sucking.  Your baby will fall asleep.  Your baby's body will relax.  Your baby will retain a small amount of milk in his or her mouth.  Your baby will let go of your breast by himself or herself. Signs from you  Breasts that have increased in firmness, weight, and size 1-3 hours after feeding.  Breasts that are softer immediately after breastfeeding.  Increased milk volume, as well as a change in milk consistency and color by the fifth day of breastfeeding.  Nipples that are not sore, cracked, or bleeding. Signs that your baby is getting enough milk  Wetting at least 1-2 diapers during the first 24 hours after birth.  Wetting at least 5-6 diapers every 24 hours for the first week after birth. The urine should be clear or pale yellow by the age of 5 days.  Wetting 6-8 diapers every 24 hours as your baby continues to grow and develop.  At least 3 stools in a 24-hour period by the age of 5 days. The stool should be soft and yellow.  At least 3 stools in a 24-hour period by the age of 7 days. The stool should be seedy and yellow.  No loss of weight greater than 10% of birth weight during the first 3 days of life.  Average weight gain of 4-7 oz (113-198 g) per week after the age of 4 days.  Consistent daily weight gain by the age of 5 days, without weight loss after the age of 2 weeks. After a feeding, your baby may spit up a small amount of milk. This is normal. Breastfeeding frequency and duration Frequent feeding will help you make more milk and can prevent sore nipples and extremely full breasts (breast engorgement). Breastfeed when you feel the need to reduce the  fullness of your breasts or when your baby shows signs of hunger. This is called "breastfeeding on demand." Signs that your baby is hungry include:  Increased alertness, activity, or restlessness.  Movement of the head from side to side.  Opening of the mouth when the corner of the mouth or cheek is stroked (rooting).  Increased sucking sounds, smacking lips, cooing, sighing, or squeaking.  Hand-to-mouth movements and sucking on fingers or hands.  Fussing or crying. Avoid introducing a pacifier to your baby in the first 4-6 weeks after your baby is born. After this time, you may choose to use a pacifier. Research has shown that pacifier use during the first year of a baby's life decreases the risk of sudden infant death syndrome (SIDS). Allow your baby to feed on each breast as long as he or she wants. When your baby unlatches or falls asleep while feeding from the first breast, offer the second breast. Because newborns are often sleepy in the first few weeks of life, you may need to awaken your baby to get him or her to feed. Breastfeeding times will vary from baby to baby. However, the following rules can serve as a guide to help  you make sure that your baby is properly fed: °· Newborns (babies 4 weeks of age or younger) may breastfeed every 1-3 hours. °· Newborns should not go without breastfeeding for longer than 3 hours during the day or 5 hours during the night. °· You should breastfeed your baby a minimum of 8 times in a 24-hour period. °Breast milk pumping ° °  ° °Pumping and storing breast milk allows you to make sure that your baby is exclusively fed your breast milk, even at times when you are unable to breastfeed. This is especially important if you go back to work while you are still breastfeeding, or if you are not able to be present during feedings. Your lactation consultant can help you find a method of pumping that works best for you and give you guidelines about how long it is safe  to store breast milk. °Caring for your breasts while you breastfeed °Nipples can become dry, cracked, and sore while breastfeeding. The following recommendations can help keep your breasts moisturized and healthy: °· Avoid using soap on your nipples. °· Wear a supportive bra designed especially for nursing. Avoid wearing underwire-style bras or extremely tight bras (sports bras). °· Air-dry your nipples for 3-4 minutes after each feeding. °· Use only cotton bra pads to absorb leaked breast milk. Leaking of breast milk between feedings is normal. °· Use lanolin on your nipples after breastfeeding. Lanolin helps to maintain your skin's normal moisture barrier. Pure lanolin is not harmful (not toxic) to your baby. You may also hand express a few drops of breast milk and gently massage that milk into your nipples and allow the milk to air-dry. °In the first few weeks after giving birth, some women experience breast engorgement. Engorgement can make your breasts feel heavy, warm, and tender to the touch. Engorgement peaks within 3-5 days after you give birth. The following recommendations can help to ease engorgement: °· Completely empty your breasts while breastfeeding or pumping. You may want to start by applying warm, moist heat (in the shower or with warm, water-soaked hand towels) just before feeding or pumping. This increases circulation and helps the milk flow. If your baby does not completely empty your breasts while breastfeeding, pump any extra milk after he or she is finished. °· Apply ice packs to your breasts immediately after breastfeeding or pumping, unless this is too uncomfortable for you. To do this: °? Put ice in a plastic bag. °? Place a towel between your skin and the bag. °? Leave the ice on for 20 minutes, 2-3 times a day. °· Make sure that your baby is latched on and positioned properly while breastfeeding. °If engorgement persists after 48 hours of following these recommendations, contact your  health care provider or a lactation consultant. °Overall health care recommendations while breastfeeding °· Eat 3 healthy meals and 3 snacks every day. Well-nourished mothers who are breastfeeding need an additional 450-500 calories a day. You can meet this requirement by increasing the amount of a balanced diet that you eat. °· Drink enough water to keep your urine pale yellow or clear. °· Rest often, relax, and continue to take your prenatal vitamins to prevent fatigue, stress, and low vitamin and mineral levels in your body (nutrient deficiencies). °· Do not use any products that contain nicotine or tobacco, such as cigarettes and e-cigarettes. Your baby may be harmed by chemicals from cigarettes that pass into breast milk and exposure to secondhand smoke. If you need help quitting, ask your health   care provider.  Avoid alcohol.  Do not use illegal drugs or marijuana.  Talk with your health care provider before taking any medicines. These include over-the-counter and prescription medicines as well as vitamins and herbal supplements. Some medicines that may be harmful to your baby can pass through breast milk.  It is possible to become pregnant while breastfeeding. If birth control is desired, ask your health care provider about options that will be safe while breastfeeding your baby. Where to find more information: [employer]: www.llli.org Contact a health care provider if:  You feel like you want to stop breastfeeding or have become frustrated with breastfeeding.  Your nipples are cracked or bleeding.  Your breasts are red, tender, or warm.  You have: ? Painful breasts or nipples. ? A swollen area on either breast. ? A fever or chills. ? Nausea or vomiting. ? Drainage other than breast milk from your nipples.  Your breasts do not become full before feedings by the fifth day after you give birth.  You feel sad and depressed.  Your baby is: ? Too sleepy to eat  well. ? Having trouble sleeping. ? More than 521 week old and wetting fewer than 6 diapers in a 24-hour period. ? Not gaining weight by 365 days of age.  Your baby has fewer than 3 stools in a 24-hour period.  Your baby's skin or the white parts of his or her eyes become yellow. Get help right away if:  Your baby is overly tired (lethargic) and does not want to wake up and feed.  Your baby develops an unexplained fever. Summary  Breastfeeding offers many health benefits for infant and mothers.  Try to breastfeed your infant when he or she shows early signs of hunger.  Gently tickle or stroke your baby's lips with your finger or nipple to allow the baby to open his or her mouth. Bring the baby to your breast. Make sure that much of the areola is in your baby's mouth. Offer one side and burp the baby before you offer the other side.  Talk with your health care provider or lactation consultant if you have questions or you face problems as you breastfeed. This information is not intended to replace advice given to you by your health care provider. Make sure you discuss any questions you have with your health care provider. Document Released: 10/22/2005 Document Revised: 01/16/2018 Document Reviewed: 11/23/2016 Elsevier Patient Education  2020 [employer]

## 2019-10-08 LAB — OBSTETRIC PANEL, INCLUDING HIV
Antibody Screen: NEGATIVE
Basophils Absolute: 0.1 10*3/uL (ref 0.0–0.2)
Basos: 1 %
EOS (ABSOLUTE): 0.1 10*3/uL (ref 0.0–0.4)
Eos: 1 %
HIV Screen 4th Generation wRfx: NONREACTIVE
Hematocrit: 40.8 % (ref 34.0–46.6)
Hemoglobin: 13.6 g/dL (ref 11.1–15.9)
Hepatitis B Surface Ag: NEGATIVE
Immature Grans (Abs): 0 10*3/uL (ref 0.0–0.1)
Immature Granulocytes: 0 %
Lymphocytes Absolute: 2.3 10*3/uL (ref 0.7–3.1)
Lymphs: 22 %
MCH: 27.1 pg (ref 26.6–33.0)
MCHC: 33.3 g/dL (ref 31.5–35.7)
MCV: 81 fL (ref 79–97)
Monocytes Absolute: 0.5 10*3/uL (ref 0.1–0.9)
Monocytes: 5 %
Neutrophils Absolute: 7.1 10*3/uL — ABNORMAL HIGH (ref 1.4–7.0)
Neutrophils: 71 %
Platelets: 297 10*3/uL (ref 150–450)
RBC: 5.02 x10E6/uL (ref 3.77–5.28)
RDW: 14.4 % (ref 11.7–15.4)
RPR Ser Ql: NONREACTIVE
Rh Factor: POSITIVE
Rubella Antibodies, IGG: 1.22 index (ref >0.99)
WBC: 10 10*3/uL (ref 3.4–10.8)

## 2019-10-08 LAB — URINE CYTOLOGY ANCILLARY ONLY
Chlamydia: NEGATIVE
Comment: NEGATIVE
Comment: NORMAL
Neisseria Gonorrhea: NEGATIVE

## 2019-10-10 LAB — URINE CULTURE, OB REFLEX

## 2019-10-10 LAB — CULTURE, OB URINE

## 2019-10-12 MED ORDER — CEPHALEXIN 500 MG PO CAPS: 500.0000 mg | ORAL_CAPSULE | Freq: Four times a day (QID) | ORAL | 2 refills | Status: DC

## 2019-10-12 NOTE — Addendum Note (Signed)
Addended by: Mora Bellman on: 10/12/2019 09:43 AM   Modules accepted: Orders

## 2019-10-15 ENCOUNTER — Encounter: Payer: Self-pay | Admitting: Obstetrics and Gynecology

## 2019-11-04 ENCOUNTER — Encounter: Payer: Self-pay | Admitting: Obstetrics and Gynecology

## 2019-11-04 ENCOUNTER — Telehealth (INDEPENDENT_AMBULATORY_CARE_PROVIDER_SITE_OTHER): Payer: Medicaid Other | Admitting: Obstetrics and Gynecology

## 2019-11-04 DIAGNOSIS — O99322 Drug use complicating pregnancy, second trimester: Secondary | ICD-10-CM

## 2019-11-04 DIAGNOSIS — F1910 Other psychoactive substance abuse, uncomplicated: Secondary | ICD-10-CM

## 2019-11-04 DIAGNOSIS — Z3A15 15 weeks gestation of pregnancy: Secondary | ICD-10-CM

## 2019-11-04 DIAGNOSIS — Z3480 Encounter for supervision of other normal pregnancy, unspecified trimester: Secondary | ICD-10-CM

## 2019-11-04 MED ORDER — AZITHROMYCIN 250 MG PO TABS: ORAL_TABLET | ORAL | 1 refills | Status: DC

## 2019-11-04 NOTE — Progress Notes (Signed)
Virtual ROB Changed to a Telehealth per pt request.  Pt notes w/ recent UTI tx she had a allergic reaction to medication Rash/hives.   Pt wants to know safe medication for sinus sx's.

## 2019-11-04 NOTE — Progress Notes (Signed)
   TELEHEALTH OBSTETRICS PRENATAL VIRTUAL VIDEO VISIT ENCOUNTER NOTE  Provider location: [facility] [place]   I connected with Dody Kage on 11/04/19 at  9:30 AM EST by MyChart Video Encounter at home and verified that I am speaking with the correct person using two identifiers.   I discussed the limitations, risks, security and privacy concerns of performing an evaluation and management service virtually and the availability of in person appointments. I also discussed with the patient that there may be a patient responsible charge related to this service. The patient expressed understanding and agreed to proceed. Subjective:  Penny Clark is a 28 y.o. G3P2002 at [redacted]w[redacted]d being seen today for ongoing prenatal care.  She is currently monitored for the following issues for this low-risk pregnancy and has Anxiety and depression; Substance abuse ([place]); Mild tobacco abuse; Cocaine use complicating pregnancy; SVD (spontaneous vaginal delivery); and Supervision of other normal pregnancy, antepartum on their problem list.  Patient reports facial pressure over the sinuses on going for 1 month.  Contractions: Not present. Vag. Bleeding: None.  Movement: Absent. Denies any leaking of fluid.   The following portions of the patient's history were reviewed and updated as appropriate: allergies, current medications, past family history, past medical history, past social history, past surgical history and problem list.   Objective:  There were no vitals filed for this visit.  Fetal Status:     Movement: Absent     General:  Alert, oriented and cooperative. Patient is in no acute distress.  Respiratory: Normal respiratory effort, no problems with respiration noted  Mental Status: Normal mood and affect. Normal behavior. Normal judgment and thought content.  Rest of physical exam deferred due to type of encounter  Imaging: No results found.  Assessment and Plan:  Pregnancy: G3P2002 at  [redacted]w[redacted]d 1. Supervision of other normal pregnancy, antepartum Patient is doing well without complaints Rx z-pak provided Anatomy ultrasound scheduled on 1/22 with AFP  2. Substance abuse ([place]) Denies any relapse  Preterm labor symptoms and general obstetric precautions including but not limited to vaginal bleeding, contractions, leaking of fluid and fetal movement were reviewed in detail with the patient. I discussed the assessment and treatment plan with the patient. The patient was provided an opportunity to ask questions and all were answered. The patient agreed with the plan and demonstrated an understanding of the instructions. The patient was advised to call back or seek an in-person office evaluation/go to [facility] at [facility] for any urgent or concerning symptoms. Please refer to After Visit Summary for other counseling recommendations.   I provided 11 minutes of face-to-face time during this encounter.  Return in about 4 weeks (around 12/02/2019) for Virtual, ROB, Low risk.  Future Appointments  Date Time Provider [place]  11/27/2019 10:00 AM [place]  11/27/2019 10:30 AM [facility] [place] 1 [facility]-M[facility] M[facility]-US    Mora Bellman, MD [facility], [place]

## 2019-11-06 NOTE — L&D Delivery Note (Addendum)
OB/GYN Faculty Practice Delivery Note  Penny Clark is a 29 y.o. [id number] s/p SVD at [redacted]w[redacted]d. She was admitted for IOL for BPP 6/8.   ROM: 1h 64m with clear fluid, spontaneous GBS Status: Negative Maximum Maternal Temperature: 99.57F  Labor Progress: . Foley bulb placed and received Cytotec x2 for cervical ripening.  Pitocin started.  Had an epidural placed. SROM occurred. Progressed to complete without complication.  Delivery Date/Time: 04/26/2020 at 1155 Delivery: Called to room and patient was complete and pushing. Head delivered direct OA. A loose nuchal cord was present and was delivered through. Shoulder and body delivered in usual fashion. Infant with spontaneous cry, placed on mother's abdomen, dried and stimulated. Cord clamped x 2 after 1-minute delay, and cut by FOB under my direct supervision. Cord blood drawn. Placenta delivered spontaneously with gentle cord traction. Fundus firm with massage and Pitocin. Labia, perineum, vagina, and cervix were inspected, found to be intact.   Placenta: Intact, 3 vessel cord Complications: None Lacerations: None EBL: 150 cc Analgesia: Epidural  Infant: Viable female  APGARs 8/9  per medical record   Penny Genene Churn, MD PGY-2 Resident Family Medicine 04/26/2020, 12:08 PM  OB FELLOW DELIVERY ATTESTATION  I was gloved and present for the delivery in its entirety, and I agree with the above resident's note.    Jerilynn Birkenhead, MD [facility] Family Medicine Fellow, [facility] for [employer], [facility]

## 2019-11-18 ENCOUNTER — Encounter: Payer: Self-pay | Admitting: Obstetrics and Gynecology

## 2019-11-18 DIAGNOSIS — R8271 Bacteriuria: Secondary | ICD-10-CM | POA: Insufficient documentation

## 2019-11-18 DIAGNOSIS — O99891 Other specified diseases and conditions complicating pregnancy: Secondary | ICD-10-CM | POA: Insufficient documentation

## 2019-11-27 ENCOUNTER — Other Ambulatory Visit (HOSPITAL_COMMUNITY): Payer: Self-pay | Admitting: *Deleted

## 2019-11-27 ENCOUNTER — Other Ambulatory Visit: Payer: Self-pay

## 2019-11-27 ENCOUNTER — Ambulatory Visit (HOSPITAL_COMMUNITY)
Admission: RE | Admit: 2019-11-27 | Discharge: 2019-11-27 | Disposition: A | Discharge status: Home / Self Care | Payer: Medicaid Other | Source: Ambulatory Visit | Attending: Obstetrics and Gynecology | Admitting: Obstetrics and Gynecology

## 2019-11-27 ENCOUNTER — Other Ambulatory Visit: Payer: Self-pay | Admitting: Obstetrics and Gynecology

## 2019-11-27 ENCOUNTER — Other Ambulatory Visit: Payer: Medicaid Other

## 2019-11-27 DIAGNOSIS — O09292 Supervision of pregnancy with other poor reproductive or obstetric history, second trimester: Secondary | ICD-10-CM

## 2019-11-27 DIAGNOSIS — Z362 Encounter for other antenatal screening follow-up: Secondary | ICD-10-CM

## 2019-11-27 DIAGNOSIS — Z3A19 19 weeks gestation of pregnancy: Secondary | ICD-10-CM

## 2019-11-27 DIAGNOSIS — O99212 Obesity complicating pregnancy, second trimester: Secondary | ICD-10-CM | POA: Diagnosis not present

## 2019-11-27 DIAGNOSIS — Z3480 Encounter for supervision of other normal pregnancy, unspecified trimester: Secondary | ICD-10-CM

## 2019-11-27 DIAGNOSIS — O99332 Smoking (tobacco) complicating pregnancy, second trimester: Secondary | ICD-10-CM | POA: Diagnosis not present

## 2019-12-02 ENCOUNTER — Telehealth (INDEPENDENT_AMBULATORY_CARE_PROVIDER_SITE_OTHER): Payer: Medicaid Other | Admitting: Obstetrics

## 2019-12-02 ENCOUNTER — Other Ambulatory Visit: Payer: Self-pay

## 2019-12-02 ENCOUNTER — Emergency Department (HOSPITAL_COMMUNITY): Payer: Medicaid Other

## 2019-12-02 ENCOUNTER — Emergency Department (HOSPITAL_COMMUNITY)
Admission: EM | Admit: 2019-12-02 | Discharge: 2019-12-02 | Disposition: A | Discharge status: Home / Self Care | Payer: Medicaid Other | Attending: Emergency Medicine | Admitting: Emergency Medicine

## 2019-12-02 ENCOUNTER — Inpatient Hospital Stay (EMERGENCY_DEPARTMENT_HOSPITAL)
Admission: AD | Admit: 2019-12-02 | Discharge: 2019-12-02 | Disposition: A | Discharge status: Still In Hospital | Payer: Medicaid Other | Source: Home / Self Care | Attending: Obstetrics & Gynecology | Admitting: Obstetrics & Gynecology

## 2019-12-02 ENCOUNTER — Encounter: Payer: Self-pay | Admitting: Obstetrics

## 2019-12-02 DIAGNOSIS — J40 Bronchitis, not specified as acute or chronic: Secondary | ICD-10-CM | POA: Insufficient documentation

## 2019-12-02 DIAGNOSIS — F17210 Nicotine dependence, cigarettes, uncomplicated: Secondary | ICD-10-CM | POA: Insufficient documentation

## 2019-12-02 DIAGNOSIS — Z3A19 19 weeks gestation of pregnancy: Secondary | ICD-10-CM

## 2019-12-02 DIAGNOSIS — O26892 Other specified pregnancy related conditions, second trimester: Secondary | ICD-10-CM | POA: Diagnosis not present

## 2019-12-02 DIAGNOSIS — O99512 Diseases of the respiratory system complicating pregnancy, second trimester: Secondary | ICD-10-CM | POA: Insufficient documentation

## 2019-12-02 DIAGNOSIS — Z3480 Encounter for supervision of other normal pregnancy, unspecified trimester: Secondary | ICD-10-CM

## 2019-12-02 DIAGNOSIS — R0789 Other chest pain: Secondary | ICD-10-CM

## 2019-12-02 DIAGNOSIS — Z79899 Other long term (current) drug therapy: Secondary | ICD-10-CM | POA: Diagnosis not present

## 2019-12-02 DIAGNOSIS — O99332 Smoking (tobacco) complicating pregnancy, second trimester: Secondary | ICD-10-CM | POA: Insufficient documentation

## 2019-12-02 DIAGNOSIS — R079 Chest pain, unspecified: Secondary | ICD-10-CM

## 2019-12-02 DIAGNOSIS — Z3481 Encounter for supervision of other normal pregnancy, first trimester: Secondary | ICD-10-CM

## 2019-12-02 LAB — CBC
HCT: 39.5 % (ref 36.0–46.0)
Hemoglobin: 12.9 g/dL (ref 12.0–15.0)
MCH: 27.2 pg (ref 26.0–34.0)
MCHC: 32.7 g/dL (ref 30.0–36.0)
MCV: 83.2 fL (ref 80.0–100.0)
Platelets: 256 10*3/uL (ref 150–400)
RBC: 4.75 MIL/uL (ref 3.87–5.11)
RDW: 14.1 % (ref 11.5–15.5)
WBC: 13.6 10*3/uL — ABNORMAL HIGH (ref 4.0–10.5)
nRBC: 0 % (ref 0.0–0.2)

## 2019-12-02 LAB — AFP, SERUM, OPEN SPINA BIFIDA
AFP MoM: 1.09
AFP Value: 43.6 ng/mL
Gest. Age on Collection Date: 19 weeks
Maternal Age At EDD: 29.7 yr
OSBR Risk 1 IN: 9231
Test Results:: NEGATIVE
Weight: 214 [lb_av]

## 2019-12-02 LAB — BASIC METABOLIC PANEL
Anion gap: 10 (ref 5–15)
BUN: 9 mg/dL (ref 6–20)
CO2: 20 mmol/L — ABNORMAL LOW (ref 22–32)
Calcium: 9.3 mg/dL (ref 8.9–10.3)
Chloride: 106 mmol/L (ref 98–111)
Creatinine, Ser: 0.57 mg/dL (ref 0.44–1.00)
GFR calc Af Amer: >60 mL/min (ref >60)
GFR calc non Af Amer: >60 mL/min (ref >60)
Glucose, Bld: 91 mg/dL (ref 70–99)
Potassium: 4.1 mmol/L (ref 3.5–5.1)
Sodium: 136 mmol/L (ref 135–145)

## 2019-12-02 LAB — TROPONIN I (HIGH SENSITIVITY)
Troponin I (High Sensitivity): 3 ng/L (ref <18)
Troponin I (High Sensitivity): <2 ng/L (ref <18)

## 2019-12-02 MED ORDER — LIDOCAINE 4 % EX CREA: 1.0000 "application " | TOPICAL_CREAM | Freq: Three times a day (TID) | CUTANEOUS | 0 refills | Status: DC | PRN

## 2019-12-02 MED ORDER — SODIUM CHLORIDE 0.9% FLUSH: 3.0000 mL | Freq: Once | INTRAVENOUS | Status: DC

## 2019-12-02 MED ORDER — DIPHENHYDRAMINE HCL 25 MG PO TABS: 25.0000 mg | ORAL_TABLET | Freq: Every evening | ORAL | 0 refills | Status: DC | PRN

## 2019-12-02 MED ORDER — SALINE SPRAY 0.65 % NA SOLN: 1.0000 | NASAL | 0 refills | Status: DC | PRN

## 2019-12-02 MED ORDER — GUAIFENESIN ER 600 MG PO TB12: 600.0000 mg | ORAL_TABLET | Freq: Two times a day (BID) | ORAL | 0 refills | Status: DC | PRN

## 2019-12-02 NOTE — Progress Notes (Signed)
TELEHEALTH OBSTETRICS PRENATAL VIRTUAL VIDEO VISIT ENCOUNTER NOTE  [facility]   I connected with Cheynne Radle on 12/02/19 at  9:30 AM EST by OB MyChart Video Encounter at home and verified that I am speaking with the correct person using two identifiers.   I discussed the limitations, risks, security and privacy concerns of performing an evaluation and management service virtually and the availability of in person appointments. I also discussed with the patient that there may be a patient responsible charge related to this service. The patient expressed understanding and agreed to proceed. Subjective:  Penny Clark is a 29 y.o. G3P2002 at 9020w5d being seen today for ongoing prenatal care.  She is currently monitored for the following issues for this low-risk pregnancy and has Anxiety and depression; Substance abuse (HCC); Mild tobacco abuse; Cocaine use complicating pregnancy; SVD (spontaneous vaginal delivery); Supervision of other normal pregnancy, antepartum; and Asymptomatic bacteriuria during pregnancy on their problem list.  Patient reports no complaints.  Contractions: Not present. Vag. Bleeding: None.  Movement: Present. Denies any leaking of fluid.   The following portions of the patient's history were reviewed and updated as appropriate: allergies, current medications, past family history, past medical history, past social history, past surgical history and problem list.   Objective:  There were no vitals filed for this visit.  Fetal Status:     Movement: Present     General:  Alert, oriented and cooperative. Patient is in no acute distress.  Respiratory: Normal respiratory effort, no problems with respiration noted  Mental Status: Normal mood and affect. Normal behavior. Normal judgment and thought content.  Rest of physical exam deferred due to type of encounter  Imaging: US MFM OB DETAIL +14 WK  Result Date:  11/27/2019 ----------------------------------------------------------------------  OBSTETRICS REPORT                       (Signed Final 11/27/2019 12:23 pm) ---------------------------------------------------------------------- Patient Info  ID #:       [id number]                          D.O.B.:  1991-06-17 (28 yrs)  Name:       Penny Clark                    Visit Date: 11/27/2019 10:34 am ---------------------------------------------------------------------- Performed By  Performed By:     Tomma Lightningevin Vics             Ref. Address:     Faculty                    RDMS,RVT  Attending:        Ma RingsVictor Fang MD         [facility]  Referred By:      Catalina AntiguaPEGGY                    CONSTANT MD ---------------------------------------------------------------------- Orders   #  Description  Code         Ordered By   1  [place] MFM OB DETAIL +14 WK              [id number]     PEGGY CONSTANT  ----------------------------------------------------------------------   #  Order #                    Accession #                 Episode #   1  [id number]                  [id number]                  [id number]  ---------------------------------------------------------------------- Indications   Obesity complicating pregnancy, second         O99.212   trimester (Pre Pregnancy BMI 35.6)   [redacted] weeks gestation of pregnancy                D1V.61   Smoking complicating pregnancy, second         O99.332   trimester (0.5 pk/day)   Encounter for antenatal screening for          Z36.3   malformations   Poor obstetrical history (cocaine use +        O09.299   08/12/18, pt denies replapse)   Low risk NIPS  ---------------------------------------------------------------------- Vital Signs  Weight (lb): 214                               Height:        5'5"  BMI:         35.61 ---------------------------------------------------------------------- Fetal  Evaluation  Num Of Fetuses:         1  Fetal Heart Rate(bpm):  140  Cardiac Activity:       Observed  Presentation:           Variable  Placenta:               Posterior  P. Cord Insertion:      Not well visualized  Amniotic Fluid  AFI FV:      Within normal limits                              Largest Pocket(cm)                              5.13 ---------------------------------------------------------------------- Biometry  BPD:      41.5  mm     G. Age:  18w 4d         32  %    CI:        70.25   %    70 - 86                                                          FL/HC:      16.7   %    16.1 - 18.3  HC:      157.9  mm     G. Age:  18w 5d  27  %    HC/AC:      1.20        1.09 - 1.39  AC:      131.5  mm     G. Age:  18w 5d         34  %    FL/BPD:     63.6   %  FL:       26.4  mm     G. Age:  18w 0d         13  %    FL/AC:      20.1   %    20 - 24  HUM:      25.2  mm     G. Age:  18w 0d         19  %  CER:      20.4  mm     G. Age:  19w 3d         59  %  NFT:       3.6  mm  LV:        6.2  mm  CM:        4.1  mm  Est. FW:     238  gm      0 lb 8 oz     16  % ---------------------------------------------------------------------- OB History  Gravidity:    3         Term:   2  Living:       2 ---------------------------------------------------------------------- Gestational Age  LMP:           19w 0d        Date:  07/17/19                 EDD:   04/22/20  U/S Today:     18w 4d                                        EDD:   04/25/20  Best:          19w 0d     Det. By:  LMP  (07/17/19)          EDD:   04/22/20 ---------------------------------------------------------------------- Anatomy  Cranium:               Appears normal         LVOT:                   Appears normal  Cavum:                 Appears normal         Aortic Arch:            Appears normal  Ventricles:            Appears normal         Ductal Arch:            Not well visualized  Choroid Plexus:        Appears normal         Diaphragm:               Appears normal  Cerebellum:            Appears normal         Stomach:  Appears normal, left                                                                        sided  Posterior Fossa:       Appears normal         Abdomen:                Appears normal  Nuchal Fold:           Appears normal         Abdominal Wall:         Appears nml (cord                                                                        insert, abd wall)  Face:                  Appears normal         Cord Vessels:           Appears normal (3                         (orbits and profile)                           vessel cord)  Lips:                  Not well visualized    Kidneys:                Appear normal  Palate:                Not well visualized    Bladder:                Appears normal  Thoracic:              Appears normal         Spine:                  Appears normal  Heart:                 Not well visualized    Upper Extremities:      Appears normal  RVOT:                  Not well visualized    Lower Extremities:      Appears normal  Other:  Female gender Open hands visualized. LT heel and Bilat 5th digit          visualized. Nasal bone visualized. 3VV visualized. Technically difficult          due to maternal habitus and fetal position and movement. ---------------------------------------------------------------------- Cervix Uterus Adnexa  Cervix  Length:           3.68  cm.  Normal appearance by transabdominal scan.  Uterus  No abnormality visualized.  Left Ovary  Size(cm)       2.9  x   1.8    x  2         Vol(ml): 5.47  Within normal limits.  Right Ovary  Size(cm)       3.8  x   2.1    x  1.9       Vol(ml): 7.94  Within normal limits. ---------------------------------------------------------------------- Comments  This patient was seen for a detailed fetal anatomy scan due  to maternal obesity. She denies any significant past medical  history and denies any problems in her current pregnancy.  She had a cell  free DNA test earlier in her pregnancy which  indicated a low risk for trisomy 74, 20, and 13. A female fetus  is predicted.  She was informed that the fetal growth and amniotic fluid  level were appropriate for her gestational age.  There were no obvious fetal anomalies noted on today's  ultrasound exam.  However, today's exam was limited due to  the fetal position.  The patient was informed that anomalies may be missed due  to technical limitations. If the fetus is in a suboptimal position  or maternal habitus is increased, visualization of the fetus in  the maternal uterus may be impaired.  A follow-up exam was scheduled in 4 weeks to complete the  views of the fetal anatomy. ----------------------------------------------------------------------                   Ma Rings, MD Electronically Signed Final Report   11/27/2019 12:23 pm ----------------------------------------------------------------------   Assessment and Plan:  Pregnancy: [id number] at [redacted]w[redacted]d 1. Supervision of other normal pregnancy, antepartum   Preterm labor symptoms and general obstetric precautions including but not limited to vaginal bleeding, contractions, leaking of fluid and fetal movement were reviewed in detail with the patient. I discussed the assessment and treatment plan with the patient. The patient was provided an opportunity to ask questions and all were answered. The patient agreed with the plan and demonstrated an understanding of the instructions. The patient was advised to call back or seek an in-person office evaluation/go to MAU at [facility] for any urgent or concerning symptoms. Please refer to After Visit Summary for other counseling recommendations.   I provided 10 minutes of face-to-face time during this encounter.  Return in about 4 weeks (around 12/30/2019) for MyChart.  Future Appointments  Date Time Provider Department Center  12/25/2019 10:45 AM WH-MFC [place] 2 WH-MFCUS MFC-US  12/25/2019  10:55 AM WH-MFC NURSE WH-MFC MFC-US    Coral Ceo, MD Center for [facility], [facility] 12/02/2019

## 2019-12-02 NOTE — MAU Note (Signed)
Penny Clark is a 29 y.o. at [redacted]w[redacted]d here in MAU reporting: for the past 1.5 weeks she has been having sharp chest pains when she is laying flat/taking a deep breath. States she has been coughing and has had congestion since Nov 1. Has been seen for this and she feels better when she is on medication but then once she is off of it the symptoms return. No abdominal pain, VB, or LOF.  Onset of complaint: ongoing  Pain score: 8/10  Vitals:   12/02/19 1629  BP: (!) 141/75  Pulse: 87  Resp: 16  Temp: 98.5 F (36.9 C)  SpO2: 99%     FHT: 140  Lab orders placed from triage: none

## 2019-12-02 NOTE — ED Notes (Signed)
Pt ambulated in room, SPO2 93-100% on RA

## 2019-12-02 NOTE — MAU Provider Note (Signed)
First Provider Initiated Contact with Patient 12/02/19 1633      S Ms. Penny Clark is a 29 y.o. G39P2002 female at [redacted]w[redacted]d gestation who presents to MAU today with complaint of chest pain. Has had cough & SOB for the last month. This week reports intermittent chest pain. Denies fever/chills or sore throat. Denies abdominal pain or vaginal bleeding. History of cocaine use but denies recent use.    O BP (!) 141/75 (BP Location: Right Arm)   Pulse 87   Temp 98.5 F (36.9 C) (Oral)   Resp 16   Ht 5\' 5"  (1.651 m)   Wt 100.7 kg   LMP 07/17/2019 (Approximate)   SpO2 99% Comment: room air  BMI 36.96 kg/m  Physical Exam  Nursing note and vitals reviewed. Constitutional: She appears well-developed and well-nourished.  HENT:  Head: Normocephalic and atraumatic.  Respiratory: Effort normal. No respiratory distress.  Psychiatric: She has a normal mood and affect. Her behavior is normal. Judgment and thought content normal.   FHT present via doppler  A Chest pain [redacted] weeks gestation Elevated BP without diagnosis of hypertension  P MSE performed & patient stable FHT present No OB complaints Transfer to [facility] for evaluation - [facility]. [facility] (ED physician) notified   Adela Lank, NP 12/02/2019 4:34 PM

## 2019-12-02 NOTE — ED Triage Notes (Signed)
Pt reports chest pain for 1 week has been seen at [facility] was tested negative for covid. Pt went to MAU had fetal heart tones done and then sent to ER. Pt is [redacted] weeks pregnant.

## 2019-12-02 NOTE — MAU Note (Signed)
Report given to [facility] in [place]. Pt to go to the lobby per RN.  Transport notified.

## 2019-12-02 NOTE — Discharge Instructions (Addendum)
Take 1 to 2 tablets of Tylenol every 6 hours as needed for pain.  Do not exceed 4000 mg of Tylenol daily.  Can apply lidocaine cream to areas of pain.  Can also apply heating pad 20 minutes at a time 3-4 times daily as needed.  Use Mucinex twice daily as needed for congestion.  Can also use nasal saline spray, humidifier, steam showers, warm tea and honey, allergy medicines (including Benadryl at night) for symptoms.  Drink plenty of fluids and get rest.  Call your OB/GYN for reevaluation and for further recommendations regarding medications that are safe to take in pregnancy.  Return to the emergency department if any concerning signs or symptoms develop such as severe shortness of breath or chest pain, persistent vomiting, high fevers, or loss of consciousness.

## 2019-12-02 NOTE — ED Notes (Signed)
Patient verbalizes understanding of discharge instructions. Opportunity for questioning and answers were provided. Armband removed by staff, pt discharged from ED ambulatory.   

## 2019-12-02 NOTE — ED Provider Notes (Signed)
[name] Provider Note   CSN: [id number] Arrival date & time: 12/02/19  1708     History Chief Complaint  Patient presents with  . Chest Pain    Penny Clark is a 29 y.o. G3P2  female at 55w5dgestation presents today for evaluation of acute onset, persistent left-sided chest pains for 1-1/2 weeks.  She reports that since the day after Halloween for the last 3 months or so she has had persistent nasal congestion and productive cough.  She has tried Mucinex, allergy medicines with intermittent improvement. 1.5 weeks ago she states she had a "severe coughing fit" and in the midst of coughing developed sharp left-sided chest pains.  She states that the pain is intermittent and persistent, occurs with deep inspiration, laying on her back, movement of her left upper extremity and with cough and yawn.  She does feel a little bit winded with activity but otherwise denies significant shortness of breath.  She denies fevers, nausea, vomiting, abdominal pain, vaginal bleeding.  She went to an urgent care 1 week ago where she was prescribed a 7-day course of amoxicillin and 6-day course of prednisone with no improvement.  She had a virtual visit with her OB/GYN today where she mentioned her symptoms and was told to come to the ED for further evaluation.  No recent travel or surgeries, no hemoptysis, no prior history of DVT or PE.  She denies any leg swelling.  She is a current smoker of 5 to 7 cigarettes daily but has not smoked since last Thursday.  The history is provided by the patient.       Past Medical History:  Diagnosis Date  . Anemia   . Breastfeeding (infant) 07/16/2013  . [place] abuse ([place]   . Normal delivery 07/16/2013    Patient Active Problem List   Diagnosis Date Noted  . Asymptomatic bacteriuria during pregnancy 11/18/2019  . Supervision of other normal pregnancy, antepartum 09/02/2019  . SVD (spontaneous vaginal delivery) 08/12/2018  .  Cocaine use complicating pregnancy [id number] . Mild tobacco abuse 02/24/2018  . Substance abuse ([place] 12/19/2017  . Anxiety and depression 07/09/2014    Past Surgical History:  Procedure Laterality Date  . NO PAST SURGERIES       OB History    Gravida  3   Para  2   Term  2   Preterm      AB      Living  2     SAB      TAB      Ectopic      Multiple  0   Live Births  2           Family History  Problem Relation Age of Onset  . Cancer Maternal Grandfather     Social History   Tobacco Use  . Smoking status: Current Some Day Smoker    Packs/day: 0.50    Types: Cigarettes, E-cigarettes    Last attempt to quit: 10/31/2012    Years since quitting: 7.0  . Smokeless tobacco: Never Used  Substance Use Topics  . Alcohol use: Not Currently    Comment: social-before pregnancy  . Drug use: Yes    Types: Cocaine    Comment: Pt reports last cocaine use was 12/12/2017.    Home Medications Prior to Admission medications   Medication Sig Start Date End Date Taking? Authorizing Provider  albuterol (VENTOLIN HFA) 108 (90 Base) MCG/ACT inhaler Inhale 1-2 puffs into  the lungs every 6 (six) hours as needed for wheezing. 11/25/19  Yes [provider]  amoxicillin (AMOXIL) 500 MG capsule Take 1,000 mg by mouth 3 (three) times daily. 11/25/19  Yes [provider]  Blood Pressure KIT Use as directed 09/02/19  Yes Woodroe Mode, MD  Blood Pressure Monitoring (BLOOD PRESSURE MONITOR/L CUFF) MISC Use as directed 09/02/19  Yes Woodroe Mode, MD  predniSONE (DELTASONE) 10 MG tablet Take 40 mg by mouth daily. 11/25/19  Yes [provider]  Prenat w/o A-FeCbGl-DSS-FA-DHA (CITRANATAL 90 DHA) 90-1 & 300 MG MISC Take 1 tablet by mouth daily. 09/02/19  Yes Woodroe Mode, MD  amLODipine (NORVASC) 10 MG tablet Take 1 tablet (10 mg total) by mouth daily. Patient not taking: Reported on 09/02/2019 08/14/18   Caroline More, DO  buPROPion (WELLBUTRIN XL)  150 MG 24 hr tablet Take 1 tablet (150 mg total) by mouth daily. Patient not taking: Reported on 12/02/2019 02/24/18   Morene Crocker, CNM  diphenhydrAMINE (BENADRYL) 25 MG tablet Take 1 tablet (25 mg total) by mouth at bedtime as needed (congestion). 12/02/19   Dontrel Smethers A, PA-C  guaiFENesin (MUCINEX) 600 MG 12 hr tablet Take 1 tablet (600 mg total) by mouth 2 (two) times daily as needed for cough. 12/02/19   Takuma Cifelli A, PA-C  ibuprofen (ADVIL,MOTRIN) 600 MG tablet Take 1 tablet (600 mg total) by mouth every 6 (six) hours. Patient not taking: Reported on 09/02/2019 08/14/18   Caroline More, DO  lidocaine (LMX) 4 % cream Apply 1 application topically 3 (three) times daily as needed. 12/02/19   Nils Flack, Chanetta Moosman A, PA-C  sodium chloride (OCEAN) 0.65 % SOLN nasal spray Place 1 spray into both nostrils as needed for congestion. 12/02/19   Rodell Perna A, PA-C    Allergies    Bee venom and Keflex [cephalexin]  Review of Systems   Review of Systems  Constitutional: Negative for chills and fever.  HENT: Positive for congestion.   Respiratory: Positive for cough and shortness of breath.   Cardiovascular: Positive for chest pain. Negative for leg swelling.  Gastrointestinal: Negative for abdominal pain, nausea and vomiting.  All other systems reviewed and are negative.   Physical Exam Updated Vital Signs BP 136/77   Pulse 86   Temp (!) 97.5 F (36.4 C) (Oral)   Resp 19   LMP 07/17/2019 (Approximate)   SpO2 96%   Physical Exam Vitals and nursing note reviewed.  Constitutional:      General: She is not in acute distress.    Appearance: She is well-developed.  HENT:     Head: Normocephalic and atraumatic.  Eyes:     General:        Right eye: No discharge.        Left eye: No discharge.     Conjunctiva/sclera: Conjunctivae normal.  Neck:     Vascular: No JVD.     Trachea: No tracheal deviation.  Cardiovascular:     Rate and Rhythm: Normal rate and regular rhythm.     Pulses:            Radial pulses are 2+ on the right side and 2+ on the left side.       Dorsalis pedis pulses are 2+ on the right side and 2+ on the left side.       Posterior tibial pulses are 2+ on the right side and 2+ on the left side.     Heart sounds: Normal  heart sounds.     Comments: Homans sign absent bilaterally, no lower extremity edema, no palpable cords, compartments are soft  Pulmonary:     Effort: Pulmonary effort is normal. No tachypnea.  Chest:     Chest wall: Tenderness present.       Comments: Tenderness to palpation of the left anterior and lateral chest wall with no deformity, crepitus ecchymosis or flail segment. Abdominal:     General: Bowel sounds are normal. There is no distension.     Palpations: Abdomen is soft.     Tenderness: There is no abdominal tenderness.     Comments: Abdomen gravid  Musculoskeletal:     Cervical back: Normal range of motion and neck supple.     Right lower leg: No tenderness. No edema.     Left lower leg: No tenderness. No edema.  Skin:    General: Skin is warm and dry.     Findings: No erythema.  Neurological:     Mental Status: She is alert.  Psychiatric:        Behavior: Behavior normal.     ED Results / Procedures / Treatments   Labs (all labs ordered are listed, but only abnormal results are displayed) Labs Reviewed  BASIC METABOLIC PANEL - Abnormal; Notable for the following components:      Result Value   CO2 20 (*)    All other components within normal limits  CBC - Abnormal; Notable for the following components:   WBC 13.6 (*)    All other components within normal limits  TROPONIN I (HIGH SENSITIVITY)  TROPONIN I (HIGH SENSITIVITY)    EKG EKG Interpretation  Date/Time:  Wednesday December 02 2019 17:11:25 EST Ventricular Rate:  79 PR Interval:  142 QRS Duration: 100 QT Interval:  362 QTC Calculation: 415 R Axis:   78 Text Interpretation: Normal sinus rhythm Incomplete right bundle branch block Borderline ECG  When compared with ECG of 09/10/2014, No significant change was found Confirmed by Delora Fuel ([id number]) on 12/02/2019 11:30:25 PM   Radiology DG Chest Portable 1 View  Result Date: 12/02/2019 CLINICAL DATA:  Cough, chest pain EXAM: PORTABLE CHEST 1 VIEW COMPARISON:  04/07/2015 FINDINGS: Heart is normal size. Peribronchial thickening and interstitial prominence. No effusions. Linear bibasilar opacities, likely atelectasis. No acute bony abnormality. IMPRESSION: Bronchitic changes, bibasilar atelectasis. Electronically Signed   By: Rolm Baptise M.D.   On: 12/02/2019 21:51    Procedures Procedures (including critical care time)  Medications Ordered in ED Medications  sodium chloride flush (NS) 0.9 % injection 3 mL (has no administration in time range)    ED Course  I have reviewed the triage vital signs and the nursing notes.  Pertinent labs & imaging results that were available during my care of the patient were reviewed by me and considered in my medical decision making (see chart for details).    MDM Rules/Calculators/A&P                      Ahnika Matthew was evaluated in Emergency Department on 12/02/2019 for the symptoms described in the history of present illness. She was evaluated in the context of the global COVID-19 pandemic, which necessitated consideration that the patient might be at risk for infection with the SARS-CoV-2 virus that causes COVID-19. Institutional protocols and algorithms that pertain to the evaluation of patients at risk for COVID-19 are in a state of rapid change based on information released by regulatory bodies including the CDC  and federal and state organizations. These policies and algorithms were followed during the patient's care in the ED.  108w5dgestation pregnant female presenting for evaluation of left-sided chest pains that began in the midst of a "coughing fit" 1-1/2 weeks ago.  She has had intermittent nasal congestion and cough for a couple of months  has been taking over-the-counter medications with varied success.  She is afebrile, vital signs are stable.  She is nontoxic in appearance.  No evidence of respiratory distress on examination today and SPO2 saturations are stable in the ED at rest and with ambulation.  Lab work reviewed by me shows mild nonspecific leukocytosis which could be elevated in the setting of pregnancy.  No anemia, no metabolic derangements, no renal insufficiency.  Her EKG shows normal sinus rhythm, no acute ischemic abnormalities.  Serial troponins are negative and her symptoms do not sound cardiac in etiology.  In fact they sound more musculoskeletal and I am able to reproduce them on palpation.  I suspect that she has some musculoskeletal chest wall pain secondary to persistent cough.  I have a lower suspicion of PE though she is at risk due to her pregnancy given she has no tachycardia, hypoxia or tachypnea on exam today.  Chest x-ray shows bronchitic changes, bibasilar atelectasis but no evidence of edema or consolidation.  No CHF, dissection, cardiac tamponade, pneumonia, pericarditis, esophageal rupture or pneumothorax.  On reevaluation she is resting comfortably in no apparent distress.  Discussed symptomatic management.  Encouraged her to quit smoking and avoid NSAIDs or recreational drug use.  Recommend follow-up with PCP and OB/GYN for reevaluation and discussion of medications that are safe to take in pregnancy.  Discussed strict ED return precautions. Patient verbalized understanding of and agreement with plan and is safe for discharge home at this time.  Final Clinical Impression(s) / ED Diagnoses Final diagnoses:  Bronchitis  Acute chest wall pain    Rx / DC Orders ED Discharge Orders         Ordered    guaiFENesin (MUCINEX) 600 MG 12 hr tablet  2 times daily PRN     12/02/19 2313    lidocaine (LMX) 4 % cream  3 times daily PRN     12/02/19 2313    diphenhydrAMINE (BENADRYL) 25 MG tablet  At bedtime PRN      12/02/19 2313    sodium chloride (OCEAN) 0.65 % SOLN nasal spray  As needed     12/02/19 2314           FRenita Papa PA-C 12/02/19 [place] D[place] DO 12/03/19 2118

## 2019-12-25 ENCOUNTER — Encounter (HOSPITAL_COMMUNITY): Payer: Self-pay

## 2019-12-25 ENCOUNTER — Other Ambulatory Visit (HOSPITAL_COMMUNITY): Payer: Self-pay | Admitting: *Deleted

## 2019-12-25 ENCOUNTER — Ambulatory Visit (HOSPITAL_COMMUNITY): Payer: Medicaid Other | Admitting: *Deleted

## 2019-12-25 ENCOUNTER — Ambulatory Visit (HOSPITAL_COMMUNITY)
Admission: RE | Admit: 2019-12-25 | Discharge: 2019-12-25 | Disposition: A | Discharge status: Home / Self Care | Payer: Medicaid Other | Source: Ambulatory Visit | Attending: Obstetrics and Gynecology | Admitting: Obstetrics and Gynecology

## 2019-12-25 ENCOUNTER — Other Ambulatory Visit: Payer: Self-pay

## 2019-12-25 VITALS — BP 121/72 | HR 82 | Temp 97.4°F

## 2019-12-25 DIAGNOSIS — O99332 Smoking (tobacco) complicating pregnancy, second trimester: Secondary | ICD-10-CM | POA: Diagnosis not present

## 2019-12-25 DIAGNOSIS — Z362 Encounter for other antenatal screening follow-up: Secondary | ICD-10-CM | POA: Diagnosis not present

## 2019-12-25 DIAGNOSIS — Z3A23 23 weeks gestation of pregnancy: Secondary | ICD-10-CM

## 2019-12-25 DIAGNOSIS — O0990 Supervision of high risk pregnancy, unspecified, unspecified trimester: Secondary | ICD-10-CM

## 2019-12-25 DIAGNOSIS — O99212 Obesity complicating pregnancy, second trimester: Secondary | ICD-10-CM

## 2019-12-30 ENCOUNTER — Telehealth (INDEPENDENT_AMBULATORY_CARE_PROVIDER_SITE_OTHER): Payer: Medicaid Other | Admitting: Obstetrics and Gynecology

## 2019-12-30 ENCOUNTER — Encounter: Payer: Self-pay | Admitting: Obstetrics and Gynecology

## 2019-12-30 DIAGNOSIS — Z3480 Encounter for supervision of other normal pregnancy, unspecified trimester: Secondary | ICD-10-CM

## 2019-12-30 DIAGNOSIS — Z3A23 23 weeks gestation of pregnancy: Secondary | ICD-10-CM

## 2019-12-30 DIAGNOSIS — F1910 Other psychoactive substance abuse, uncomplicated: Secondary | ICD-10-CM

## 2019-12-30 DIAGNOSIS — O99322 Drug use complicating pregnancy, second trimester: Secondary | ICD-10-CM

## 2019-12-30 NOTE — Progress Notes (Signed)
TELEHEALTH OBSTETRICS PRENATAL VIRTUAL VIDEO VISIT ENCOUNTER NOTE  Provider location: [facility] [place]   I connected with Penny Clark on 12/30/19 at 10:30 AM EST by MyChart Video Encounter at home and verified that I am speaking with the correct person using two identifiers.   I discussed the limitations, risks, security and privacy concerns of performing an evaluation and management service virtually and the availability of in person appointments. I also discussed with the patient that there may be a patient responsible charge related to this service. The patient expressed understanding and agreed to proceed. Subjective:  Penny Clark is a 29 y.o. G3P2002 at [redacted]w[redacted]d being seen today for ongoing prenatal care.  She is currently monitored for the following issues for this high-risk pregnancy and has Anxiety and depression; Substance abuse (HCC); Mild tobacco abuse; Cocaine use complicating pregnancy; Supervision of other normal pregnancy, antepartum; and Asymptomatic bacteriuria during pregnancy on their problem list.  Patient reports no complaints.  Contractions: Not present. Vag. Bleeding: None.  Movement: Present. Denies any leaking of fluid.   The following portions of the patient's history were reviewed and updated as appropriate: allergies, current medications, past family history, past medical history, past social history, past surgical history and problem list.   Objective:  There were no vitals filed for this visit.  Fetal Status:     Movement: Present     General:  Alert, oriented and cooperative. Patient is in no acute distress.  Respiratory: Normal respiratory effort, no problems with respiration noted  Mental Status: Normal mood and affect. Normal behavior. Normal judgment and thought content.  Rest of physical exam deferred due to type of encounter  Imaging: DG Chest Portable 1 View  Result Date: 12/02/2019 CLINICAL DATA:  Cough, chest pain EXAM:  PORTABLE CHEST 1 VIEW COMPARISON:  04/07/2015 FINDINGS: Heart is normal size. Peribronchial thickening and interstitial prominence. No effusions. Linear bibasilar opacities, likely atelectasis. No acute bony abnormality. IMPRESSION: Bronchitic changes, bibasilar atelectasis. Electronically Signed   By: Charlett Nose M.D.   On: 12/02/2019 21:51   [place] MFM OB FOLLOW UP  Result Date: 12/25/2019 ----------------------------------------------------------------------  OBSTETRICS REPORT                       (Signed Final 12/25/2019 12:07 pm) ---------------------------------------------------------------------- Patient Info  ID #:       [id number]                          D.O.B.:  1991-05-23 (28 yrs)  Name:       Penny Clark                    Visit Date: 12/25/2019 10:49 am ---------------------------------------------------------------------- Performed By  Performed By:     Ramond Craver Tester BS,       Ref. Address:     Faculty                    RDMS, RVT  Attending:        Noralee Space MD        [facility]  Referred By:      Gigi Gin                    Abbe Bula MD ---------------------------------------------------------------------- Orders   #  Description                          Code         Ordered By   1  [place] MFM OB FOLLOW UP                  21308.65     YU FANG  ----------------------------------------------------------------------   #  Order #                    Accession #                 Episode #   1  [id number]                  [id number]                  [id number]  ---------------------------------------------------------------------- Indications   [redacted] weeks gestation of pregnancy                Z3A.23   Obesity complicating pregnancy, second         O99.212   trimester (Pre Pregnancy BMI 35.6)   Smoking complicating pregnancy, second         Q03.474   trimester (0.5 pk/day)   Encounter for antenatal screening for           Z36.3   malformations   Poor obstetrical history (cocaine use +        O09.299   08/12/18, pt denies replapse)   Low risk NIPS  ---------------------------------------------------------------------- Vital Signs                                                 Height:        5'5" ---------------------------------------------------------------------- Fetal Evaluation  Num Of Fetuses:         1  Fetal Heart Rate(bpm):  146  Cardiac Activity:       Observed  Presentation:           Cephalic  Placenta:               Posterior  P. Cord Insertion:      Visualized, central  Amniotic Fluid  AFI FV:      Within normal limits                              Largest Pocket(cm)                              5.42 ---------------------------------------------------------------------- Biometry  BPD:      53.5  mm     G. Age:  22w 2d         20  %    CI:        75.06   %    70 - 86  FL/HC:      18.8   %    19.2 - 20.8  HC:      195.9  mm     G. Age:  21w 6d          4  %    HC/AC:      1.10        1.05 - 1.21  AC:      177.8  mm     G. Age:  22w 5d         31  %    FL/BPD:     69.0   %    71 - 87  FL:       36.9  mm     G. Age:  21w 5d          8  %    FL/AC:      20.8   %    20 - 24  CER:      25.2  mm     G. Age:  23w 1d         53  %  LV:        5.6  mm  CM:          4  mm  Est. FW:     485  gm      1 lb 1 oz     13  % ---------------------------------------------------------------------- OB History  Gravidity:    3         Term:   2  Living:       2 ---------------------------------------------------------------------- Gestational Age  LMP:           23w 0d        Date:  07/17/19                 EDD:   04/22/20  U/S Today:     22w 1d                                        EDD:   04/28/20  Best:          23w 0d     Det. By:  LMP  (07/17/19)          EDD:   04/22/20 ---------------------------------------------------------------------- Anatomy  Cranium:               Appears  normal         LVOT:                   Previously seen  Cavum:                 Appears normal         Aortic Arch:            Appears normal  Ventricles:            Appears normal         Ductal Arch:            Appears normal  Choroid Plexus:        Appears normal         Diaphragm:              Appears normal  Cerebellum:            Appears normal  Stomach:                Appears normal, left                                                                        sided  Posterior Fossa:       Appears normal         Abdomen:                Appears normal  Nuchal Fold:           Previously seen        Abdominal Wall:         Appears nml (cord                                                                        insert, abd wall)  Face:                  Appears normal         Cord Vessels:           Appears normal (3                         (orbits and profile)                           vessel cord)  Lips:                  Appears normal         Kidneys:                Appear normal  Palate:                Not well visualized    Bladder:                Appears normal  Thoracic:              Appears normal         Spine:                  Appears normal  Heart:                 Appears normal         Upper Extremities:      Previously seen                         (4CH, axis, and                         situs)  RVOT:                  Appears normal         Lower Extremities:      Previously seen  Other:  Female  gender prev. vis., Open hands vis prev., LT heel and Bilat 5th          digit vis. prev., NB prev. vis., 3VV visualized prev.  Technically difficult          due to maternal habitus. ---------------------------------------------------------------------- Cervix Uterus Adnexa  Cervix  Length:           4.47  cm.  Normal appearance by transabdominal scan.  Uterus  No abnormality visualized.  Left Ovary  Within normal limits. No adnexal mass visualized.  Right Ovary  Within normal limits. No adnexal mass  visualized.  Cul De Sac  No free fluid seen.  Adnexa  No abnormality visualized. ---------------------------------------------------------------------- Impression  Patient return for completion of fetal anatomy.  Fetal biometry  is consistent with her previously established dates.  Amniotic  fluid is normal and good fetal activity seen.  Fetal anatomical  survey was completed and appears normal.  Because of a history of smoking and substance use and the  current estimated fetal weight at the 13th percentile, we  recommend fetal growth assessment in 4 weeks. ---------------------------------------------------------------------- Recommendations  -An appointment was made for her to return in 4 weeks for  fetal growth assessment. ----------------------------------------------------------------------                  Noralee Space, MD Electronically Signed Final Report   12/25/2019 12:07 pm ----------------------------------------------------------------------   Assessment and Plan:  Pregnancy: [id number] at [redacted]w[redacted]d 1. Supervision of other normal pregnancy, antepartum Patient is doing well reports improvement in bronchitis Third trimester labs with glucola next visit  2. Substance abuse (HCC) Stable without any relapse  Preterm labor symptoms and general obstetric precautions including but not limited to vaginal bleeding, contractions, leaking of fluid and fetal movement were reviewed in detail with the patient. I discussed the assessment and treatment plan with the patient. The patient was provided an opportunity to ask questions and all were answered. The patient agreed with the plan and demonstrated an understanding of the instructions. The patient was advised to call back or seek an in-person office evaluation/go to [facility] at [facility] for any urgent or concerning symptoms. Please refer to After Visit Summary for other counseling recommendations.   I provided 11 minutes of face-to-face time  during this encounter.  Return in about 4 weeks (around 01/27/2020) for in person, ROB, Low risk, 2 hr glucola next visit.  Future Appointments  Date Time Provider Department Center  01/25/2020  9:45 AM [facility] NURSE [facility]  01/25/2020  9:45 AM [facility] [place] 5 WH-MFCUS M[facility]-US    Catalina Antigua, MD [facility]

## 2019-12-30 NOTE — Progress Notes (Signed)
Pt is not able to check BP today.

## 2020-01-25 ENCOUNTER — Ambulatory Visit (HOSPITAL_COMMUNITY): Payer: Medicaid Other | Admitting: *Deleted

## 2020-01-25 ENCOUNTER — Other Ambulatory Visit (HOSPITAL_COMMUNITY): Payer: Self-pay | Admitting: *Deleted

## 2020-01-25 ENCOUNTER — Ambulatory Visit (HOSPITAL_COMMUNITY)
Admission: RE | Admit: 2020-01-25 | Discharge: 2020-01-25 | Disposition: A | Discharge status: Home / Self Care | Payer: Medicaid Other | Source: Ambulatory Visit | Attending: Obstetrics and Gynecology | Admitting: Obstetrics and Gynecology

## 2020-01-25 ENCOUNTER — Encounter (HOSPITAL_COMMUNITY): Payer: Self-pay

## 2020-01-25 ENCOUNTER — Other Ambulatory Visit: Payer: Self-pay

## 2020-01-25 VITALS — BP 119/66 | HR 87 | Temp 97.4°F

## 2020-01-25 DIAGNOSIS — Z362 Encounter for other antenatal screening follow-up: Secondary | ICD-10-CM | POA: Diagnosis present

## 2020-01-25 DIAGNOSIS — Z3A27 27 weeks gestation of pregnancy: Secondary | ICD-10-CM

## 2020-01-25 DIAGNOSIS — O09299 Supervision of pregnancy with other poor reproductive or obstetric history, unspecified trimester: Secondary | ICD-10-CM | POA: Diagnosis not present

## 2020-01-25 DIAGNOSIS — O99212 Obesity complicating pregnancy, second trimester: Secondary | ICD-10-CM

## 2020-01-25 DIAGNOSIS — O0990 Supervision of high risk pregnancy, unspecified, unspecified trimester: Secondary | ICD-10-CM | POA: Diagnosis present

## 2020-01-25 DIAGNOSIS — Z3689 Encounter for other specified antenatal screening: Secondary | ICD-10-CM

## 2020-01-27 ENCOUNTER — Ambulatory Visit (INDEPENDENT_AMBULATORY_CARE_PROVIDER_SITE_OTHER): Payer: Medicaid Other | Admitting: Family Medicine

## 2020-01-27 ENCOUNTER — Other Ambulatory Visit: Payer: Self-pay

## 2020-01-27 ENCOUNTER — Other Ambulatory Visit: Payer: Medicaid Other

## 2020-01-27 VITALS — BP 110/73 | HR 97 | Wt 224.0 lb

## 2020-01-27 DIAGNOSIS — Z23 Encounter for immunization: Secondary | ICD-10-CM | POA: Diagnosis not present

## 2020-01-27 DIAGNOSIS — F17200 Nicotine dependence, unspecified, uncomplicated: Secondary | ICD-10-CM

## 2020-01-27 DIAGNOSIS — O99322 Drug use complicating pregnancy, second trimester: Secondary | ICD-10-CM

## 2020-01-27 DIAGNOSIS — R8271 Bacteriuria: Secondary | ICD-10-CM

## 2020-01-27 DIAGNOSIS — F1910 Other psychoactive substance abuse, uncomplicated: Secondary | ICD-10-CM

## 2020-01-27 DIAGNOSIS — O99342 Other mental disorders complicating pregnancy, second trimester: Secondary | ICD-10-CM

## 2020-01-27 DIAGNOSIS — O99332 Smoking (tobacco) complicating pregnancy, second trimester: Secondary | ICD-10-CM

## 2020-01-27 DIAGNOSIS — Z3A27 27 weeks gestation of pregnancy: Secondary | ICD-10-CM

## 2020-01-27 DIAGNOSIS — F1490 Cocaine use, unspecified, uncomplicated: Secondary | ICD-10-CM

## 2020-01-27 DIAGNOSIS — Z3480 Encounter for supervision of other normal pregnancy, unspecified trimester: Secondary | ICD-10-CM

## 2020-01-27 DIAGNOSIS — O99891 Other specified diseases and conditions complicating pregnancy: Secondary | ICD-10-CM

## 2020-01-27 DIAGNOSIS — F418 Other specified anxiety disorders: Secondary | ICD-10-CM

## 2020-01-27 NOTE — Progress Notes (Signed)
Patient reports fetal movement, denies pain. Pt states that she will get tdap vaccine today

## 2020-01-27 NOTE — Progress Notes (Signed)
   PRENATAL VISIT NOTE  Subjective:  Penny Clark is a 29 y.o. G3P2002 at [redacted]w[redacted]d being seen today for ongoing prenatal care.  She is currently monitored for the following issues for this low-risk pregnancy and has Anxiety and depression; Substance abuse (HCC); Mild tobacco abuse; Cocaine use complicating pregnancy; Supervision of other normal pregnancy, antepartum; and Asymptomatic bacteriuria during pregnancy on their problem list.  Patient reports occasional rib pain that previously she was told was bronchitis. Pain mostly subsided and declines any further treatments at this time.  Contractions: Not present. Vag. Bleeding: None.  Movement: Present. Denies leaking of fluid.   The following portions of the patient's history were reviewed and updated as appropriate: allergies, current medications, past family history, past medical history, past social history, past surgical history and problem list.   Objective:   Vitals:   01/27/20 0911  BP: 110/73  Pulse: 97  Weight: 224 lb (101.6 kg)    Fetal Status: Fetal Heart Rate (bpm): 136 Fundal Height: 27 cm Movement: Present     General:  Alert, oriented and cooperative. Patient is in no acute distress.  Skin: Skin is warm and dry. No rash noted.   Cardiovascular: Normal heart rate noted  Respiratory: Normal respiratory effort, no problems with respiration noted  Abdomen: Soft, gravid, appropriate for gestational age.  Pain/Pressure: Absent     Pelvic: Cervical exam deferred        Extremities: Normal range of motion.  Edema: None  Mental Status: Normal mood and affect. Normal behavior. Normal judgment and thought content.   Assessment and Plan:  Pregnancy: [id number] at [redacted]w[redacted]d Penny Clark was seen today for routine prenatal visit.  Diagnoses and all orders for this visit:  Supervision of other normal pregnancy, antepartum -     Tdap vaccine greater than or equal to 7yo IM -     HIV antibody (with reflex) -     RPR -     CBC -     Glucose  Tolerance, 2 Hours w/1 Hour -  RTC in 4 weeks; prefers in-person as she cannot find BP cuff - Discussed birth control options; considering Nexplanon - Last [place] 3/22: EFW 17%, return in 4 weeks for fetal growth assessment   Substance abuse (HCC)       - Denies tobacco or substance use; stable   Preterm labor symptoms and general obstetric precautions including but not limited to vaginal bleeding, contractions, leaking of fluid and fetal movement were reviewed in detail with the patient. Please refer to After Visit Summary for other counseling recommendations.   Return in about 4 weeks (around 02/24/2020) for in-person.  Future Appointments  Date Time Provider Department Center  02/22/2020  9:15 AM [facility] NURSE [facility]  02/22/2020  9:15 AM [facility] [place] 4 [facility]    Joselyn Arrow, MD

## 2020-01-27 NOTE — Patient Instructions (Signed)
Etonogestrel implant What is this medicine? ETONOGESTREL (et oh noe JES trel) is a contraceptive (birth control) device. It is used to prevent pregnancy. It can be used for up to 3 years. This medicine may be used for other purposes; ask your health care provider or pharmacist if you have questions. COMMON BRAND NAME(S): Implanon, Nexplanon What should I tell my health care provider before I take this medicine? They need to know if you have any of these conditions:  abnormal vaginal bleeding  blood vessel disease or blood clots  breast, cervical, endometrial, ovarian, liver, or uterine cancer  diabetes  gallbladder disease  heart disease or recent heart attack  high blood pressure  high cholesterol or triglycerides  kidney disease  liver disease  migraine headaches  seizures  stroke  tobacco smoker  an unusual or allergic reaction to etonogestrel, anesthetics or antiseptics, other medicines, foods, dyes, or preservatives  pregnant or trying to get pregnant  breast-feeding How should I use this medicine? This device is inserted just under the skin on the inner side of your upper arm by a health care professional. Talk to your pediatrician regarding the use of this medicine in children. Special care may be needed. Overdosage: If you think you have taken too much of this medicine contact a poison control center or emergency room at once. NOTE: This medicine is only for you. Do not share this medicine with others. What if I miss a dose? This does not apply. What may interact with this medicine? Do not take this medicine with any of the following medications:  amprenavir  fosamprenavir This medicine may also interact with the following medications:  acitretin  aprepitant  armodafinil  bexarotene  bosentan  carbamazepine  certain medicines for fungal infections like fluconazole, ketoconazole, itraconazole and voriconazole  certain medicines to treat  hepatitis, HIV or AIDS  cyclosporine  felbamate  griseofulvin  lamotrigine  modafinil  oxcarbazepine  phenobarbital  phenytoin  primidone  rifabutin  rifampin  rifapentine  St. John's wort  topiramate This list may not describe all possible interactions. Give your health care provider a list of all the medicines, herbs, non-prescription drugs, or dietary supplements you use. Also tell them if you smoke, drink alcohol, or use illegal drugs. Some items may interact with your medicine. What should I watch for while using this medicine? This product does not protect you against HIV infection (AIDS) or other sexually transmitted diseases. You should be able to feel the implant by pressing your fingertips over the skin where it was inserted. Contact your doctor if you cannot feel the implant, and use a non-hormonal birth control method (such as condoms) until your doctor confirms that the implant is in place. Contact your doctor if you think that the implant may have broken or become bent while in your arm. You will receive a user card from your health care provider after the implant is inserted. The card is a record of the location of the implant in your upper arm and when it should be removed. Keep this card with your health records. What side effects may I notice from receiving this medicine? Side effects that you should report to your doctor or health care professional as soon as possible:  allergic reactions like skin rash, itching or hives, swelling of the face, lips, or tongue  breast lumps, breast tissue changes, or discharge  breathing problems  changes in emotions or moods  coughing up blood  if you feel that the implant   may have broken or bent while in your arm  high blood pressure  pain, irritation, swelling, or bruising at the insertion site  scar at site of insertion  signs of infection at the insertion site such as fever, and skin redness, pain or  discharge  signs and symptoms of a blood clot such as breathing problems; changes in vision; chest pain; severe, sudden headache; pain, swelling, warmth in the leg; trouble speaking; sudden numbness or weakness of the face, arm or leg  signs and symptoms of liver injury like dark yellow or brown urine; general ill feeling or flu-like symptoms; light-colored stools; loss of appetite; nausea; right upper belly pain; unusually weak or tired; yellowing of the eyes or skin  unusual vaginal bleeding, discharge Side effects that usually do not require medical attention (report to your doctor or health care professional if they continue or are bothersome):  acne  breast pain or tenderness  headache  irregular menstrual bleeding  nausea This list may not describe all possible side effects. Call your doctor for medical advice about side effects. You may report side effects to FDA at 1-800-FDA-1088. Where should I keep my medicine? This drug is given in a hospital or clinic and will not be stored at home. NOTE: This sheet is a summary. It may not cover all possible information. If you have questions about this medicine, talk to your doctor, pharmacist, or health care provider.  2020 Elsevier/Gold Standard (2019-08-04 11:33:04)  

## 2020-01-28 LAB — CBC
Hematocrit: 38.1 % (ref 34.0–46.6)
Hemoglobin: 12.5 g/dL (ref 11.1–15.9)
MCH: 27.4 pg (ref 26.6–33.0)
MCHC: 32.8 g/dL (ref 31.5–35.7)
MCV: 84 fL (ref 79–97)
Platelets: 212 10*3/uL (ref 150–450)
RBC: 4.56 x10E6/uL (ref 3.77–5.28)
RDW: 13.5 % (ref 11.7–15.4)
WBC: 9.7 10*3/uL (ref 3.4–10.8)

## 2020-01-28 LAB — GLUCOSE TOLERANCE, 2 HOURS W/ 1HR
Glucose, 1 hour: 117 mg/dL (ref 65–179)
Glucose, 2 hour: 84 mg/dL (ref 65–152)
Glucose, Fasting: 81 mg/dL (ref 65–91)

## 2020-01-28 LAB — HIV ANTIBODY (ROUTINE TESTING W REFLEX): HIV Screen 4th Generation wRfx: NONREACTIVE

## 2020-01-28 LAB — RPR: RPR Ser Ql: NONREACTIVE

## 2020-02-22 ENCOUNTER — Ambulatory Visit (HOSPITAL_COMMUNITY): Payer: Medicaid Other | Admitting: *Deleted

## 2020-02-22 ENCOUNTER — Encounter (HOSPITAL_COMMUNITY): Payer: Self-pay

## 2020-02-22 ENCOUNTER — Ambulatory Visit (HOSPITAL_COMMUNITY)
Admission: RE | Admit: 2020-02-22 | Discharge: 2020-02-22 | Disposition: A | Discharge status: Home / Self Care | Payer: Medicaid Other | Source: Ambulatory Visit | Attending: Obstetrics and Gynecology | Admitting: Obstetrics and Gynecology

## 2020-02-22 ENCOUNTER — Other Ambulatory Visit: Payer: Self-pay

## 2020-02-22 VITALS — BP 114/67 | HR 79 | Temp 97.1°F

## 2020-02-22 DIAGNOSIS — O99210 Obesity complicating pregnancy, unspecified trimester: Secondary | ICD-10-CM | POA: Diagnosis present

## 2020-02-22 DIAGNOSIS — Z3689 Encounter for other specified antenatal screening: Secondary | ICD-10-CM

## 2020-02-22 DIAGNOSIS — O99213 Obesity complicating pregnancy, third trimester: Secondary | ICD-10-CM

## 2020-02-22 DIAGNOSIS — E669 Obesity, unspecified: Secondary | ICD-10-CM

## 2020-02-22 DIAGNOSIS — Z3A31 31 weeks gestation of pregnancy: Secondary | ICD-10-CM

## 2020-02-22 DIAGNOSIS — O09293 Supervision of pregnancy with other poor reproductive or obstetric history, third trimester: Secondary | ICD-10-CM

## 2020-02-24 ENCOUNTER — Other Ambulatory Visit: Payer: Self-pay

## 2020-02-24 ENCOUNTER — Ambulatory Visit (INDEPENDENT_AMBULATORY_CARE_PROVIDER_SITE_OTHER): Payer: Medicaid Other | Admitting: Obstetrics & Gynecology

## 2020-02-24 VITALS — BP 104/69 | HR 88 | Wt 227.0 lb

## 2020-02-24 DIAGNOSIS — O99343 Other mental disorders complicating pregnancy, third trimester: Secondary | ICD-10-CM

## 2020-02-24 DIAGNOSIS — F329 Major depressive disorder, single episode, unspecified: Secondary | ICD-10-CM

## 2020-02-24 DIAGNOSIS — O99323 Drug use complicating pregnancy, third trimester: Secondary | ICD-10-CM

## 2020-02-24 DIAGNOSIS — Z3480 Encounter for supervision of other normal pregnancy, unspecified trimester: Secondary | ICD-10-CM

## 2020-02-24 DIAGNOSIS — F419 Anxiety disorder, unspecified: Secondary | ICD-10-CM

## 2020-02-24 DIAGNOSIS — Z3A31 31 weeks gestation of pregnancy: Secondary | ICD-10-CM

## 2020-02-24 DIAGNOSIS — F1490 Cocaine use, unspecified, uncomplicated: Secondary | ICD-10-CM

## 2020-02-24 NOTE — Patient Instructions (Signed)

## 2020-02-24 NOTE — Progress Notes (Signed)
   PRENATAL VISIT NOTE  Subjective:  Penny Clark is a 29 y.o. G3P2002 at [redacted]w[redacted]d being seen today for ongoing prenatal care.  She is currently monitored for the following issues for this high-risk pregnancy and has Anxiety and depression; Substance abuse (HCC); Mild tobacco abuse; Cocaine use complicating pregnancy; Supervision of other normal pregnancy, antepartum; and Asymptomatic bacteriuria during pregnancy on their problem list.  Patient reports pressure.  Contractions: Not present. Vag. Bleeding: None.  Movement: Present. Denies leaking of fluid.   The following portions of the patient's history were reviewed and updated as appropriate: allergies, current medications, past family history, past medical history, past social history, past surgical history and problem list.   Objective:   Vitals:   02/24/20 0932  BP: 104/69  Pulse: 88  Weight: 227 lb (103 kg)    Fetal Status: Fetal Heart Rate (bpm): 134   Movement: Present     General:  Alert, oriented and cooperative. Patient is in no acute distress.  Skin: Skin is warm and dry. No rash noted.   Cardiovascular: Normal heart rate noted  Respiratory: Normal respiratory effort, no problems with respiration noted  Abdomen: Soft, gravid, appropriate for gestational age.  Pain/Pressure: Absent     Pelvic: Cervical exam deferred        Extremities: Normal range of motion.  Edema: Trace  Mental Status: Normal mood and affect. Normal behavior. Normal judgment and thought content.   Assessment and Plan:  Pregnancy: G3P2002 at [redacted]w[redacted]d 1. Supervision of other normal pregnancy, normal growth by [place]   2. Anxiety and depression   Preterm labor symptoms and general obstetric precautions including but not limited to vaginal bleeding, contractions, leaking of fluid and fetal movement were reviewed in detail with the patient. Please refer to After Visit Summary for other counseling recommendations.   Return in about 2 weeks (around 03/09/2020) for  virtual.  Future Appointments  Date Time Provider Department Center  03/09/2020  8:45 AM [place], Margarette Asal, DO [facility] None    Scheryl Darter, MD

## 2020-03-09 ENCOUNTER — Telehealth (INDEPENDENT_AMBULATORY_CARE_PROVIDER_SITE_OTHER): Payer: Medicaid Other | Admitting: Family Medicine

## 2020-03-09 DIAGNOSIS — O99891 Other specified diseases and conditions complicating pregnancy: Secondary | ICD-10-CM | POA: Diagnosis not present

## 2020-03-09 DIAGNOSIS — F329 Major depressive disorder, single episode, unspecified: Secondary | ICD-10-CM

## 2020-03-09 DIAGNOSIS — F32A Depression, unspecified: Secondary | ICD-10-CM

## 2020-03-09 DIAGNOSIS — F419 Anxiety disorder, unspecified: Secondary | ICD-10-CM | POA: Diagnosis not present

## 2020-03-09 DIAGNOSIS — Z3480 Encounter for supervision of other normal pregnancy, unspecified trimester: Secondary | ICD-10-CM

## 2020-03-09 DIAGNOSIS — R8271 Bacteriuria: Secondary | ICD-10-CM

## 2020-03-09 DIAGNOSIS — F1910 Other psychoactive substance abuse, uncomplicated: Secondary | ICD-10-CM | POA: Diagnosis not present

## 2020-03-09 NOTE — Progress Notes (Signed)
   TELEHEALTH VIRTUAL OBSTETRICS VISIT ENCOUNTER NOTE  I connected with Penny Clark on 03/09/20 at  8:45 AM EDT by telephone at home and verified that I am speaking with the correct person using two identifiers.   I discussed the limitations, risks, security and privacy concerns of performing an evaluation and management service by telephone and the availability of in person appointments. I also discussed with the patient that there may be a patient responsible charge related to this service. The patient expressed understanding and agreed to proceed.  Subjective:  Penny Clark is a 29 y.o. G3P2002 at [redacted]w[redacted]d being followed for ongoing prenatal care.  She is currently monitored for the following issues for this low-risk pregnancy and has Anxiety and depression; Substance abuse (HCC); Mild tobacco abuse; Cocaine use complicating pregnancy; Supervision of other normal pregnancy, antepartum; and Asymptomatic bacteriuria during pregnancy on their problem list.  Patient reports no complaints. Reports fetal movement. Denies any contractions, bleeding or leaking of fluid.   The following portions of the patient's history were reviewed and updated as appropriate: allergies, current medications, past family history, past medical history, past social history, past surgical history and problem list.   Objective:   General:  Alert, oriented and cooperative.   Mental Status: Normal mood and affect perceived. Normal judgment and thought content.  Rest of physical exam deferred due to type of encounter  Assessment and Plan:  Pregnancy: G3P2002 at [redacted]w[redacted]d 1. Supervision of other normal pregnancy, antepartum - Continue routine prenatal care - reviewed access to care, given [facility] for pregnancy-related concerns  2. Substance abuse (HCC) - last cocaine use 08/2018, completed rehab program  3. Asymptomatic bacteriuria during pregnancy - will need TOC at  next visit  4. Anxiety and depression - stable  Preterm labor symptoms and general obstetric precautions including but not limited to vaginal bleeding, contractions, leaking of fluid and fetal movement were reviewed in detail with the patient.  I discussed the assessment and treatment plan with the patient. The patient was provided an opportunity to ask questions and all were answered. The patient agreed with the plan and demonstrated an understanding of the instructions. The patient was advised to call back or seek an in-person office evaluation/go to MAU at [facility] for any urgent or concerning symptoms. Please refer to After Visit Summary for other counseling recommendations.   I provided 9 minutes of non-face-to-face time during this encounter.  Return for 2 weeks in person for 36 weeks swabs.  No future appointments.  Marlowe Alt, DO Center for [employer], [facility]

## 2020-03-09 NOTE — Patient Instructions (Signed)

## 2020-03-23 ENCOUNTER — Encounter: Payer: Self-pay | Admitting: Obstetrics

## 2020-03-23 ENCOUNTER — Ambulatory Visit (INDEPENDENT_AMBULATORY_CARE_PROVIDER_SITE_OTHER): Payer: Medicaid Other | Admitting: Obstetrics

## 2020-03-23 ENCOUNTER — Other Ambulatory Visit (HOSPITAL_COMMUNITY)
Admission: RE | Admit: 2020-03-23 | Discharge: 2020-03-23 | Disposition: A | Discharge status: Home / Self Care | Payer: Medicaid Other | Source: Ambulatory Visit | Attending: Obstetrics | Admitting: Obstetrics

## 2020-03-23 ENCOUNTER — Other Ambulatory Visit: Payer: Self-pay

## 2020-03-23 VITALS — BP 129/74 | HR 86 | Wt 231.0 lb

## 2020-03-23 DIAGNOSIS — Z3A35 35 weeks gestation of pregnancy: Secondary | ICD-10-CM

## 2020-03-23 DIAGNOSIS — Z3480 Encounter for supervision of other normal pregnancy, unspecified trimester: Secondary | ICD-10-CM

## 2020-03-23 DIAGNOSIS — O99333 Smoking (tobacco) complicating pregnancy, third trimester: Secondary | ICD-10-CM

## 2020-03-23 DIAGNOSIS — O99343 Other mental disorders complicating pregnancy, third trimester: Secondary | ICD-10-CM

## 2020-03-23 DIAGNOSIS — O99323 Drug use complicating pregnancy, third trimester: Secondary | ICD-10-CM

## 2020-03-23 NOTE — Progress Notes (Signed)
Subjective:  Penny Clark is a 29 y.o. G3P2002 at [redacted]w[redacted]d being seen today for ongoing prenatal care.  She is currently monitored for the following issues for this low-risk pregnancy and has Anxiety and depression; Substance abuse (HCC); Mild tobacco abuse; Cocaine use complicating pregnancy; Supervision of other normal pregnancy, antepartum; and Asymptomatic bacteriuria during pregnancy on their problem list.  Patient reports no complaints.  Contractions: Not present. Vag. Bleeding: None.  Movement: Present. Denies leaking of fluid.   The following portions of the patient's history were reviewed and updated as appropriate: allergies, current medications, past family history, past medical history, past social history, past surgical history and problem list. Problem list updated.  Objective:   Vitals:   03/23/20 0829  BP: 129/74  Pulse: 86  Weight: 231 lb (104.8 kg)    Fetal Status:     Movement: Present     General:  Alert, oriented and cooperative. Patient is in no acute distress.  Skin: Skin is warm and dry. No rash noted.   Cardiovascular: Normal heart rate noted  Respiratory: Normal respiratory effort, no problems with respiration noted  Abdomen: Soft, gravid, appropriate for gestational age. Pain/Pressure: Absent     Pelvic:  Cervical exam deferred        Extremities: Normal range of motion.  Edema: Trace  Mental Status: Normal mood and affect. Normal behavior. Normal judgment and thought content.   Urinalysis:      Assessment and Plan:  Pregnancy: G3P2002 at [redacted]w[redacted]d  1. Supervision of other normal pregnancy, antepartum Rx: - Culture, beta strep (group b only) - Cervicovaginal ancillary only  Preterm labor symptoms and general obstetric precautions including but not limited to vaginal bleeding, contractions, leaking of fluid and fetal movement were reviewed in detail with the patient. Please refer to After Visit Summary for other counseling recommendations.  No follow-ups on  file.   Brock Bad, MD  03/23/2020

## 2020-03-24 ENCOUNTER — Other Ambulatory Visit: Payer: Self-pay | Admitting: Obstetrics

## 2020-03-24 DIAGNOSIS — B9689 Other specified bacterial agents as the cause of diseases classified elsewhere: Secondary | ICD-10-CM

## 2020-03-24 LAB — CERVICOVAGINAL ANCILLARY ONLY
Bacterial Vaginitis (gardnerella): NEGATIVE
Candida Glabrata: NEGATIVE
Candida Vaginitis: NEGATIVE
Chlamydia: NEGATIVE
Comment: NEGATIVE
Comment: NEGATIVE
Comment: NEGATIVE
Comment: NEGATIVE
Comment: NEGATIVE
Comment: NORMAL
Neisseria Gonorrhea: NEGATIVE
Trichomonas: POSITIVE — AB

## 2020-03-24 MED ORDER — TINIDAZOLE 500 MG PO TABS: 1000.0000 mg | ORAL_TABLET | Freq: Every day | ORAL | 2 refills | Status: DC

## 2020-03-25 ENCOUNTER — Telehealth: Payer: Self-pay

## 2020-03-25 ENCOUNTER — Other Ambulatory Visit: Payer: Self-pay | Admitting: Obstetrics

## 2020-03-25 DIAGNOSIS — A5901 Trichomonal vulvovaginitis: Secondary | ICD-10-CM

## 2020-03-25 MED ORDER — METRONIDAZOLE 500 MG PO TABS: 2000.0000 mg | ORAL_TABLET | Freq: Once | ORAL | 0 refills | Status: AC

## 2020-03-25 NOTE — Telephone Encounter (Signed)
S/w pt and advised of positive Trich, rx sent to pharmacy, advised that partner needs to be tested and treated also.

## 2020-03-27 LAB — CULTURE, BETA STREP (GROUP B ONLY): Strep Gp B Culture: NEGATIVE

## 2020-03-31 ENCOUNTER — Telehealth (INDEPENDENT_AMBULATORY_CARE_PROVIDER_SITE_OTHER): Payer: Medicaid Other | Admitting: Certified Nurse Midwife

## 2020-03-31 ENCOUNTER — Encounter: Payer: Self-pay | Admitting: Certified Nurse Midwife

## 2020-03-31 DIAGNOSIS — O23599 Infection of other part of genital tract in pregnancy, unspecified trimester: Secondary | ICD-10-CM | POA: Insufficient documentation

## 2020-03-31 DIAGNOSIS — Z3A36 36 weeks gestation of pregnancy: Secondary | ICD-10-CM

## 2020-03-31 DIAGNOSIS — R8271 Bacteriuria: Secondary | ICD-10-CM

## 2020-03-31 DIAGNOSIS — O23593 Infection of other part of genital tract in pregnancy, third trimester: Secondary | ICD-10-CM

## 2020-03-31 DIAGNOSIS — Z3480 Encounter for supervision of other normal pregnancy, unspecified trimester: Secondary | ICD-10-CM

## 2020-03-31 DIAGNOSIS — A5901 Trichomonal vulvovaginitis: Secondary | ICD-10-CM

## 2020-03-31 DIAGNOSIS — O99891 Other specified diseases and conditions complicating pregnancy: Secondary | ICD-10-CM

## 2020-03-31 NOTE — Progress Notes (Signed)
   TELEHEALTH OBSTETRICS VISIT ENCOUNTER NOTE  I connected with Penny Clark on 03/31/20 at  9:35 AM EDT by telephone at home and verified that I am speaking with the correct person using two identifiers.   I discussed the limitations, risks, security and privacy concerns of performing an evaluation and management service by telephone and the availability of in person appointments. I also discussed with the patient that there may be a patient responsible charge related to this service. The patient expressed understanding and agreed to proceed.  Subjective:  Penny Clark is a 29 y.o. G3P2002 at [redacted]w[redacted]d being followed for ongoing prenatal care.  She is currently monitored for the following issues for this low-risk pregnancy and has Anxiety and depression; Substance abuse (HCC); Mild tobacco abuse; Cocaine use complicating pregnancy; Supervision of other normal pregnancy, antepartum; Asymptomatic bacteriuria during pregnancy; and Trichomonal vaginitis during pregnancy on their problem list.  Patient reports no complaints. Reports fetal movement. Denies any contractions, bleeding or leaking of fluid.   The following portions of the patient's history were reviewed and updated as appropriate: allergies, current medications, past family history, past medical history, past social history, past surgical history and problem list.   Objective:   General:  Alert, oriented and cooperative.   Mental Status: Normal mood and affect perceived. Normal judgment and thought content.  Rest of physical exam deferred due to type of encounter  Assessment and Plan:  Pregnancy: G3P2002 at [redacted]w[redacted]d 1. Supervision of other normal pregnancy, antepartum - Patient doing well, no complaints or concerns at this time  - Routine prenatal care  - Anticipatory guidance on upcoming appointments  - patient unable to take BP at this time, d/t being in the car, encouraged patient to take BP when she gets home and send message to office  with reading, patient verbalizes understanding   2. Trichomonal vaginitis during pregnancy in third trimester - patient reports completion of medication  - TOC at next appointment   3. Asymptomatic bacteriuria during pregnancy - TOC needed at next appointment   Preterm labor symptoms and general obstetric precautions including but not limited to vaginal bleeding, contractions, leaking of fluid and fetal movement were reviewed in detail with the patient.  I discussed the assessment and treatment plan with the patient. The patient was provided an opportunity to ask questions and all were answered. The patient agreed with the plan and demonstrated an understanding of the instructions. The patient was advised to call back or seek an in-person office evaluation/go to [facility]U at [facility] for any urgent or concerning symptoms. Please refer to After Visit Summary for other counseling recommendations.   I provided 8 minutes of non-face-to-face time during this encounter.  Return in about 2 weeks (around 04/14/2020) for ROB- in person/TOC.  Sharyon Cable, CNM Center for [employer], [facility]

## 2020-03-31 NOTE — Patient Instructions (Signed)
Reasons to go to MAU:  1.  Contractions are  5 minutes apart or less, each last 1 minute, these have been going on for 1-2 hours, and you cannot walk or talk during them 2.  You have a large gush of fluid, or a trickle of fluid that will not stop and you have to wear a pad 3.  You have bleeding that is bright red, heavier than spotting--like menstrual bleeding (spotting can be normal in early labor or after a check of your cervix) 4.  You do not feel the baby moving like he/she normally does  

## 2020-03-31 NOTE — Progress Notes (Signed)
Virtual ROB  CC: low back pain

## 2020-04-14 ENCOUNTER — Other Ambulatory Visit (HOSPITAL_COMMUNITY)
Admission: RE | Admit: 2020-04-14 | Discharge: 2020-04-14 | Disposition: A | Discharge status: Home / Self Care | Payer: Medicaid Other | Source: Ambulatory Visit | Attending: Obstetrics & Gynecology | Admitting: Obstetrics & Gynecology

## 2020-04-14 ENCOUNTER — Other Ambulatory Visit: Payer: Self-pay

## 2020-04-14 ENCOUNTER — Encounter: Payer: Self-pay | Admitting: Obstetrics & Gynecology

## 2020-04-14 ENCOUNTER — Ambulatory Visit (INDEPENDENT_AMBULATORY_CARE_PROVIDER_SITE_OTHER): Payer: Medicaid Other | Admitting: Obstetrics & Gynecology

## 2020-04-14 VITALS — BP 122/80 | HR 83 | Wt 232.0 lb

## 2020-04-14 DIAGNOSIS — O2343 Unspecified infection of urinary tract in pregnancy, third trimester: Secondary | ICD-10-CM | POA: Insufficient documentation

## 2020-04-14 DIAGNOSIS — Z3A38 38 weeks gestation of pregnancy: Secondary | ICD-10-CM

## 2020-04-14 NOTE — Progress Notes (Signed)
   PRENATAL VISIT NOTE  Subjective:  Penny Clark is a 29 y.o. G3P2002 at [redacted]w[redacted]d being seen today for ongoing prenatal care.  She is currently monitored for the following issues for this low-risk pregnancy and has Anxiety and depression; Substance abuse (HCC); Mild tobacco abuse; Cocaine use complicating pregnancy; Supervision of other normal pregnancy, antepartum; Asymptomatic bacteriuria during pregnancy; and Trichomonal vaginitis during pregnancy on their problem list.  Patient reports occasional contractions and intermittent pressure.  Contractions: Not present. Vag. Bleeding: None.  Movement: Present. Denies leaking of fluid.   The following portions of the patient's history were reviewed and updated as appropriate: allergies, current medications, past family history, past medical history, past social history, past surgical history and problem list.   Objective:   Vitals:   04/14/20 0822  BP: 122/80  Pulse: 83  Weight: 105.2 kg    Fetal Status: Fetal Heart Rate (bpm): 150   Movement: Present     General:  Alert, oriented and cooperative. Patient is in no acute distress.  Skin: Skin is warm and dry. No rash noted.   Cardiovascular: Normal heart rate noted  Respiratory: Normal respiratory effort, no problems with respiration noted  Abdomen: Soft, gravid, appropriate for gestational age.  Pain/Pressure: Present     Pelvic: Cervical exam deferred        Extremities: Normal range of motion.     Mental Status: Normal mood and affect. Normal behavior. Normal judgment and thought content.   Assessment and Plan:  Pregnancy: G3P2002 at [redacted]w[redacted]d 1. Urinary tract infection in mother during third trimester of pregnancy S/p abx, TOC today. Induction scheduled for 6/25.  - Culture, OB Urine - Urine cytology ancillary only([place])  Term labor symptoms and general obstetric precautions including but not limited to vaginal bleeding, contractions, leaking of fluid and fetal movement were  reviewed in detail with the patient. Please refer to After Visit Summary for other counseling recommendations.   No follow-ups on file.  No future appointments.  Malachy Chamber, MD

## 2020-04-14 NOTE — Progress Notes (Signed)
Induction Assessment Scheduling Form: Fax to [facility]:  [phone] Route to [facility] Labor Delivery   Penny Clark                                                                                   DOB:  [birth year]                                                            MRN:  [id number]  Phone:  Home Phone [phone]  Mobile [phone]    Provider:  [facility] ([facility])  Admission Date/Time:  04/29/2020 GP:  [id number]     Gestational age on admission:  89                                              Estimated Date of Delivery: 04/22/20  Dating Criteria: LMP  There were no vitals filed for this visit.  GBS: Negative/-- (05/19 0859) HIV:  Non Reactive (03/24 0936)  Reason for induction:  postdate    Method of induction(proposed):  Cytotec   Scheduling Provider Signature:  Marya Landry Dellion, CMA        Per Dr Elbert Ewings. Williams.                                      Today's Date:  04/14/2020

## 2020-04-14 NOTE — Patient Instructions (Signed)

## 2020-04-15 LAB — URINE CYTOLOGY ANCILLARY ONLY
Comment: NEGATIVE
Trichomonas: POSITIVE — AB

## 2020-04-16 LAB — URINE CULTURE, OB REFLEX

## 2020-04-16 LAB — CULTURE, OB URINE

## 2020-04-18 ENCOUNTER — Other Ambulatory Visit: Payer: Self-pay | Admitting: Family Medicine

## 2020-04-18 ENCOUNTER — Other Ambulatory Visit (HOSPITAL_COMMUNITY): Payer: Self-pay | Admitting: Advanced Practice Midwife

## 2020-04-18 ENCOUNTER — Other Ambulatory Visit: Payer: Self-pay

## 2020-04-18 MED ORDER — METRONIDAZOLE 500 MG PO TABS
500.0000 mg | ORAL_TABLET | Freq: Two times a day (BID) | ORAL | 0 refills | Status: DC
Start: 2020-04-18 — End: 2020-04-28

## 2020-04-18 NOTE — Progress Notes (Signed)
Rx Flagyl 500 mg BID for 7 days per Dr. Macon Large for retreatment of Trich, pt made aware.

## 2020-04-18 NOTE — Progress Notes (Signed)
Induction orders placed  Marlowe Alt, DO OB Fellow, [facility] 04/18/2020 4:44 PM

## 2020-04-19 ENCOUNTER — Other Ambulatory Visit: Payer: Self-pay | Admitting: Obstetrics & Gynecology

## 2020-04-21 ENCOUNTER — Encounter: Payer: Self-pay | Admitting: Obstetrics and Gynecology

## 2020-04-21 ENCOUNTER — Ambulatory Visit (INDEPENDENT_AMBULATORY_CARE_PROVIDER_SITE_OTHER): Payer: Medicaid Other | Admitting: Obstetrics and Gynecology

## 2020-04-21 ENCOUNTER — Encounter (HOSPITAL_COMMUNITY): Payer: Self-pay | Admitting: *Deleted

## 2020-04-21 ENCOUNTER — Telehealth (HOSPITAL_COMMUNITY): Payer: Self-pay | Admitting: *Deleted

## 2020-04-21 ENCOUNTER — Other Ambulatory Visit: Payer: Self-pay

## 2020-04-21 VITALS — BP 124/82 | HR 82 | Wt 236.0 lb

## 2020-04-21 DIAGNOSIS — Z3A39 39 weeks gestation of pregnancy: Secondary | ICD-10-CM

## 2020-04-21 DIAGNOSIS — A5901 Trichomonal vulvovaginitis: Secondary | ICD-10-CM

## 2020-04-21 DIAGNOSIS — R8271 Bacteriuria: Secondary | ICD-10-CM

## 2020-04-21 DIAGNOSIS — O23593 Infection of other part of genital tract in pregnancy, third trimester: Secondary | ICD-10-CM

## 2020-04-21 DIAGNOSIS — O2343 Unspecified infection of urinary tract in pregnancy, third trimester: Secondary | ICD-10-CM

## 2020-04-21 DIAGNOSIS — O98313 Other infections with a predominantly sexual mode of transmission complicating pregnancy, third trimester: Secondary | ICD-10-CM

## 2020-04-21 DIAGNOSIS — Z3480 Encounter for supervision of other normal pregnancy, unspecified trimester: Secondary | ICD-10-CM

## 2020-04-21 NOTE — Patient Instructions (Signed)
[facility] [address] [place],  [place]  [address] Main: [phone] 

## 2020-04-21 NOTE — Progress Notes (Signed)
   PRENATAL VISIT NOTE  Subjective:  Penny Clark is a 29 y.o. G3P2002 at [redacted]w[redacted]d being seen today for ongoing prenatal care.  She is currently monitored for the following issues for this high-risk pregnancy and has Anxiety and depression; Substance abuse (HCC); Mild tobacco abuse; Supervision of other normal pregnancy, antepartum; Asymptomatic bacteriuria during pregnancy; and Trichomonal vaginitis during pregnancy on their problem list.  Patient reports pelvic pressure.  Contractions: Not present. Vag. Bleeding: None.  Movement: Present. Denies leaking of fluid.   The following portions of the patient's history were reviewed and updated as appropriate: allergies, current medications, past family history, past medical history, past social history, past surgical history and problem list.   Objective:   Vitals:   04/21/20 0906  BP: 124/82  Pulse: 82  Weight: 236 lb (107 kg)    Fetal Status: Fetal Heart Rate (bpm): 138   Movement: Present     General:  Alert, oriented and cooperative. Patient is in no acute distress.  Skin: Skin is warm and dry. No rash noted.   Cardiovascular: Normal heart rate noted  Respiratory: Normal respiratory effort, no problems with respiration noted  Abdomen: Soft, gravid, appropriate for gestational age.  Pain/Pressure: Present     Pelvic: Cervical exam performed in the presence of a chaperone        Extremities: Normal range of motion.  Edema: Trace  Mental Status: Normal mood and affect. Normal behavior. Normal judgment and thought content.   Assessment and Plan:  Pregnancy: G3P2002 at [redacted]w[redacted]d  1. Supervision of other normal pregnancy, antepartum IOL scheduled for 04/29/20  2. Asymptomatic bacteriuria during pregnancy  3. Trichomonal vaginitis during pregnancy in third trimester Taking flagyl now  Term labor symptoms and general obstetric precautions including but not limited to vaginal bleeding, contractions, leaking of fluid and fetal movement were  reviewed in detail with the patient. Please refer to After Visit Summary for other counseling recommendations.   Return in about 1 week (around 04/28/2020) for NST, BPP, low OB, in person.  Future Appointments  Date Time Provider Department Center  04/27/2020  9:55 AM [facility]-SCREENING [facility] None  04/29/2020  9:00 AM [facility]-LD SCHED ROOM [facility] None    Conan Bowens, MD

## 2020-04-21 NOTE — Telephone Encounter (Signed)
Preadmission screen  

## 2020-04-21 NOTE — Addendum Note (Signed)
Addended by: Leroy Libman on: 04/21/2020 09:48 AM   Modules accepted: Orders

## 2020-04-25 ENCOUNTER — Encounter (HOSPITAL_COMMUNITY): Payer: Self-pay | Admitting: Obstetrics & Gynecology

## 2020-04-25 ENCOUNTER — Encounter: Payer: Self-pay | Admitting: *Deleted

## 2020-04-25 ENCOUNTER — Inpatient Hospital Stay (HOSPITAL_COMMUNITY)
Admission: RE | Admit: 2020-04-25 | Discharge: 2020-04-28 | DRG: 806 | Disposition: A | Discharge status: Home / Self Care | Payer: Medicaid Other | Attending: Obstetrics & Gynecology | Admitting: Obstetrics & Gynecology

## 2020-04-25 ENCOUNTER — Ambulatory Visit: Payer: Medicaid Other | Admitting: *Deleted

## 2020-04-25 ENCOUNTER — Other Ambulatory Visit: Payer: Self-pay

## 2020-04-25 ENCOUNTER — Other Ambulatory Visit: Payer: Self-pay | Admitting: Obstetrics and Gynecology

## 2020-04-25 ENCOUNTER — Ambulatory Visit (HOSPITAL_BASED_OUTPATIENT_CLINIC_OR_DEPARTMENT_OTHER): Payer: Medicaid Other

## 2020-04-25 DIAGNOSIS — Z3A40 40 weeks gestation of pregnancy: Secondary | ICD-10-CM | POA: Diagnosis not present

## 2020-04-25 DIAGNOSIS — F1910 Other psychoactive substance abuse, uncomplicated: Secondary | ICD-10-CM | POA: Diagnosis present

## 2020-04-25 DIAGNOSIS — A5901 Trichomonal vulvovaginitis: Secondary | ICD-10-CM | POA: Diagnosis present

## 2020-04-25 DIAGNOSIS — O99214 Obesity complicating childbirth: Secondary | ICD-10-CM | POA: Diagnosis present

## 2020-04-25 DIAGNOSIS — Z3490 Encounter for supervision of normal pregnancy, unspecified, unspecified trimester: Secondary | ICD-10-CM | POA: Diagnosis present

## 2020-04-25 DIAGNOSIS — E669 Obesity, unspecified: Secondary | ICD-10-CM | POA: Diagnosis not present

## 2020-04-25 DIAGNOSIS — O9832 Other infections with a predominantly sexual mode of transmission complicating childbirth: Secondary | ICD-10-CM | POA: Diagnosis present

## 2020-04-25 DIAGNOSIS — Z3480 Encounter for supervision of other normal pregnancy, unspecified trimester: Secondary | ICD-10-CM | POA: Insufficient documentation

## 2020-04-25 DIAGNOSIS — F419 Anxiety disorder, unspecified: Secondary | ICD-10-CM | POA: Diagnosis present

## 2020-04-25 DIAGNOSIS — O23593 Infection of other part of genital tract in pregnancy, third trimester: Secondary | ICD-10-CM | POA: Insufficient documentation

## 2020-04-25 DIAGNOSIS — O09293 Supervision of pregnancy with other poor reproductive or obstetric history, third trimester: Secondary | ICD-10-CM | POA: Diagnosis not present

## 2020-04-25 DIAGNOSIS — O99213 Obesity complicating pregnancy, third trimester: Secondary | ICD-10-CM | POA: Diagnosis not present

## 2020-04-25 DIAGNOSIS — O26893 Other specified pregnancy related conditions, third trimester: Secondary | ICD-10-CM | POA: Diagnosis present

## 2020-04-25 DIAGNOSIS — O23599 Infection of other part of genital tract in pregnancy, unspecified trimester: Secondary | ICD-10-CM | POA: Diagnosis present

## 2020-04-25 DIAGNOSIS — Z30017 Encounter for initial prescription of implantable subdermal contraceptive: Secondary | ICD-10-CM

## 2020-04-25 DIAGNOSIS — F329 Major depressive disorder, single episode, unspecified: Secondary | ICD-10-CM | POA: Diagnosis present

## 2020-04-25 DIAGNOSIS — O480 Post-term pregnancy: Principal | ICD-10-CM | POA: Diagnosis present

## 2020-04-25 DIAGNOSIS — Z87891 Personal history of nicotine dependence: Secondary | ICD-10-CM

## 2020-04-25 DIAGNOSIS — O99333 Smoking (tobacco) complicating pregnancy, third trimester: Secondary | ICD-10-CM

## 2020-04-25 DIAGNOSIS — Z20822 Contact with and (suspected) exposure to covid-19: Secondary | ICD-10-CM | POA: Diagnosis present

## 2020-04-25 LAB — TYPE AND SCREEN
ABO/RH(D): B POS
Antibody Screen: NEGATIVE

## 2020-04-25 LAB — COMPREHENSIVE METABOLIC PANEL
ALT: 18 U/L (ref 0–44)
AST: 33 U/L (ref 15–41)
Albumin: 3.1 g/dL — ABNORMAL LOW (ref 3.5–5.0)
Alkaline Phosphatase: 143 U/L — ABNORMAL HIGH (ref 38–126)
Anion gap: 12 (ref 5–15)
BUN: 8 mg/dL (ref 6–20)
CO2: 19 mmol/L — ABNORMAL LOW (ref 22–32)
Calcium: 8.7 mg/dL — ABNORMAL LOW (ref 8.9–10.3)
Chloride: 106 mmol/L (ref 98–111)
Creatinine, Ser: 0.67 mg/dL (ref 0.44–1.00)
GFR calc Af Amer: >60 mL/min (ref >60)
GFR calc non Af Amer: >60 mL/min (ref >60)
Glucose, Bld: 131 mg/dL — ABNORMAL HIGH (ref 70–99)
Potassium: 3.8 mmol/L (ref 3.5–5.1)
Sodium: 137 mmol/L (ref 135–145)
Total Bilirubin: 0.7 mg/dL (ref 0.3–1.2)
Total Protein: 6.2 g/dL — ABNORMAL LOW (ref 6.5–8.1)

## 2020-04-25 LAB — ABO/RH: ABO/RH(D): B POS

## 2020-04-25 LAB — CBC
HCT: 38.6 % (ref 36.0–46.0)
Hemoglobin: 12.3 g/dL (ref 12.0–15.0)
MCH: 26.5 pg (ref 26.0–34.0)
MCHC: 31.9 g/dL (ref 30.0–36.0)
MCV: 83.2 fL (ref 80.0–100.0)
Platelets: 189 10*3/uL (ref 150–400)
RBC: 4.64 MIL/uL (ref 3.87–5.11)
RDW: 15.3 % (ref 11.5–15.5)
WBC: 12.1 10*3/uL — ABNORMAL HIGH (ref 4.0–10.5)
nRBC: 0 % (ref 0.0–0.2)

## 2020-04-25 LAB — SARS CORONAVIRUS 2 BY RT PCR (HOSPITAL ORDER, PERFORMED IN ~~LOC~~ HOSPITAL LAB): SARS Coronavirus 2: NEGATIVE

## 2020-04-25 MED ORDER — MISOPROSTOL 50MCG HALF TABLET
50.0000 ug | ORAL_TABLET | ORAL | Status: DC
  Administered 2020-04-25 (×2): 50 ug via BUCCAL
  Filled 2020-04-25 (×2): qty 1

## 2020-04-25 MED ORDER — OXYTOCIN-SODIUM CHLORIDE 30-0.9 UT/500ML-% IV SOLN
2.5000 [IU]/h | INTRAVENOUS | Status: DC
  Administered 2020-04-26: 2.5 [IU]/h via INTRAVENOUS
  Filled 2020-04-25: qty 500

## 2020-04-25 MED ORDER — SOD CITRATE-CITRIC ACID 500-334 MG/5ML PO SOLN: 30.0000 mL | ORAL | Status: DC | PRN

## 2020-04-25 MED ORDER — LACTATED RINGERS IV SOLN: 500.0000 mL | INTRAVENOUS | Status: DC | PRN

## 2020-04-25 MED ORDER — OXYTOCIN BOLUS FROM INFUSION
500.0000 mL | Freq: Once | INTRAVENOUS | Status: AC
  Administered 2020-04-26: 333 mL via INTRAVENOUS

## 2020-04-25 MED ORDER — LACTATED RINGERS IV SOLN: INTRAVENOUS | Status: DC

## 2020-04-25 MED ORDER — ACETAMINOPHEN 325 MG PO TABS: 650.0000 mg | ORAL_TABLET | ORAL | Status: DC | PRN

## 2020-04-25 MED ORDER — LIDOCAINE HCL (PF) 1 % IJ SOLN: 30.0000 mL | INTRAMUSCULAR | Status: DC | PRN

## 2020-04-25 MED ORDER — ONDANSETRON HCL 4 MG/2ML IJ SOLN: 4.0000 mg | Freq: Four times a day (QID) | INTRAMUSCULAR | Status: DC | PRN

## 2020-04-25 NOTE — Progress Notes (Signed)
Penny Clark is a 29 y.o. G3P2002 at [redacted]w[redacted]d admitted for IOL 2/2 BPP 6/8 after 40 weeks  Subjective: Feeling contractions, but not super uncomfortable  Objective: BP (!) 152/78   Pulse 75   Temp 98 F (36.7 C) (Oral)   Resp 18   Ht 5\' 5"  (1.651 m)   Wt 47.9 kg   LMP 07/17/2019 (Approximate)   SpO2 97%   BMI 17.57 kg/m  No intake/output data recorded.  FHT:  FHR: 120-125 bpm, variability: moderate,  accelerations:  Present,  decelerations:  Absent UC:   Not picked up by TOCO  SVE:   Dilation: 1 Effacement (%): Thick Station: -3 Exam by:: dr fair  Labs: Lab Results  Component Value Date   WBC 12.1 (H) 04/25/2020   HGB 12.3 04/25/2020   HCT 38.6 04/25/2020   MCV 83.2 04/25/2020   PLT 189 04/25/2020    Assessment / Plan: Penny Clark is a 29 y.o. G3P2002 at [redacted]w[redacted]d here for IOL for BPP 6/8 after 40 weeks.  #Labor: Vertex by exam. Foley bulb still in place. 2nd cytotec given. AROM/Pit PRN. Anticipate vaginal delivery #Pain:  Per patient request #FWB: Cat I; EFW: 3900g #ID:      GBS neg #MOF: Both #MOC: Nexplanon inpatient #Circ:   NA #Hx Trich: TOC ordered #Elevated BP: last 2 BP systolic 150s. No symptoms of Pre-E, Pre-E labs ordered.  8/8 DO OB Fellow, Faculty Practice 04/25/2020, 11:20 PM

## 2020-04-25 NOTE — MAU Note (Signed)
Presents for IOL per MFM secondary BPP 6/8.  L&D currently doesn't have a bed.

## 2020-04-25 NOTE — H&P (Signed)
OBSTETRIC ADMISSION HISTORY AND PHYSICAL  Penny Clark is a 29 y.o. female G3P2002 with IUP at 12w3dby LMP presenting for IOL for BPP 6/8 after 40 weeks. She reports +FMs, No LOF, no VB, no blurry vision, headaches or peripheral edema, and RUQ pain.  She plans on breast and bottle feeding. She requests Nexplanon for birth control. [facility] --->  Estimated Date of Delivery: 04/22/20  Sono: 6/21   @[redacted]w[redacted]d , CWD, normal anatomy, cephalic presentation, posterior placental lie, 3810g, 60% EFW, BPP 6/8  Prenatal History/Complications: Post-dates pregnancy Hx of Trich this pregnancy  BMI 38  Past Medical History: Past Medical History:  Diagnosis Date  . Anemia   . Breastfeeding (infant) 07/16/2013  . [place]   . Normal delivery 07/16/2013    Past Surgical History: Past Surgical History:  Procedure Laterality Date  . NO PAST SURGERIES      Obstetrical History: OB History    Gravida  3   Para  2   Term  2   Preterm      AB      Living  2     SAB      TAB      Ectopic      Multiple  0   Live Births  2           Social History: Social History   Socioeconomic History  . Marital status: Single    Spouse name: n/a  . Number of children: 1  . Years of education: 12+  . Highest education level: Not on file  Occupational History  . Occupation: sChemical engineer [employer]  Tobacco Use  . Smoking status: Former Smoker    Packs/day: 0.50    Types: Cigarettes, E-cigarettes    Quit date: 10/31/2012    Years since quitting: 7.4  . Smokeless tobacco: Never Used  Vaping Use  . Vaping Use: Former  Substance and Sexual Activity  . Alcohol use: Not Currently    Comment: social-before pregnancy  . Drug use: Not Currently    Types: Cocaine    Comment: Pt reports last cocaine use was 12/12/2017.  .Marland KitchenSexual activity: Yes    Partners: Male    Birth control/protection: None  Other Topics  Concern  . Not on file  Social History Narrative   Lives alone.   Her daughter lives with her father.    Her family lives nearby.   Social Determinants of Health   Financial Resource Strain:   . Difficulty of Paying Living Expenses:   Food Insecurity:   . Worried About RCharity fundraiserin the Last Year:   . RArboriculturistin the Last Year:   Transportation Needs:   . LFilm/video editor(Medical):   .Marland KitchenLack of Transportation (Non-Medical):   Physical Activity:   . Days of Exercise per Week:   . Minutes of Exercise per Session:   Stress:   . Feeling of Stress :   Social Connections:   . Frequency of Communication with Friends and Family:   . Frequency of Social Gatherings with Friends and Family:   . Attends Religious Services:   . Active Member of Clubs or Organizations:   . Attends CArchivistMeetings:   .Marland KitchenMarital Status:     Family History: Family History  Problem Relation Age of Onset  . Cancer Maternal Grandfather  Allergies: Allergies  Allergen Reactions  . Bee Venom Swelling    Says "not terribly bad", no SOB, has never carried Epipen Says "not terribly bad", no SOB, has never carried Epipen   . Keflex [Cephalexin] Hives, Itching and Rash    Medications Prior to Admission  Medication Sig Dispense Refill Last Dose  . Acetaminophen (TYLENOL PO) Take by mouth.     Marland Kitchen albuterol (VENTOLIN HFA) 108 (90 Base) MCG/ACT inhaler Inhale 1-2 puffs into the lungs every 6 (six) hours as needed for wheezing.     . Blood Pressure KIT Use as directed 1 kit 0   . Blood Pressure Monitoring (BLOOD PRESSURE MONITOR/L CUFF) MISC Use as directed 1 each 0   . diphenhydrAMINE (BENADRYL) 25 MG tablet Take 1 tablet (25 mg total) by mouth at bedtime as needed (congestion). (Patient not taking: Reported on 12/30/2019) 30 tablet 0   . guaiFENesin (MUCINEX) 600 MG 12 hr tablet Take 1 tablet (600 mg total) by mouth 2 (two) times daily as needed for cough. (Patient not  taking: Reported on 01/25/2020) 30 tablet 0   . metroNIDAZOLE (FLAGYL) 500 MG tablet Take 1 tablet (500 mg total) by mouth 2 (two) times daily. 14 tablet 0   . Prenat w/o A-FeCbGl-DSS-FA-DHA (CITRANATAL 90 DHA) 90-1 & 300 MG MISC Take 1 tablet by mouth daily. 30 each 11   . sodium chloride (OCEAN) 0.65 % SOLN nasal spray Place 1 spray into both nostrils as needed for congestion. (Patient not taking: Reported on 12/25/2019) 60 mL 0      Review of Systems   All systems reviewed and negative except as stated in HPI  Blood pressure 134/76, pulse (!) 101, temperature 97.7 F (36.5 C), temperature source Oral, resp. rate 17, height 5' 5"  (1.651 m), weight 47.9 kg, last menstrual period 07/17/2019, SpO2 97 %, unknown if currently breastfeeding. General appearance: alert, cooperative, appears stated age and no distress Lungs: normal effort Heart: regular rate  Abdomen: soft, non-tender; bowel sounds normal Pelvic: gravid uterus GU: No vaginal lesions  Extremities: Homans sign is negative, no sign of DVT Presentation: cephalic Fetal monitoringBaseline: 120 bpm, Variability: Good {> 6 bpm), Accelerations: Reactive and Decelerations: Absent Uterine activity: None Dilation: 1 Effacement (%): Thick Exam by:: dr Imran Nuon   Prenatal labs: ABO, Rh: --/--/PENDING (06/21 1808) Antibody: PENDING (06/21 1808) Rubella: 1.22 (12/02 1356) RPR: Non Reactive (03/24 0936)  HBsAg: Negative (12/02 1356)  HIV: Non Reactive (03/24 0936)  GBS: Negative/-- (05/19 0859)  2 hr Glucola WNL Genetic screening  Low risk Anatomy US normal  Prenatal Transfer Tool  Maternal Diabetes: No Genetic Screening: Normal Maternal Ultrasounds/Referrals: Normal Fetal Ultrasounds or other Referrals:  None Maternal Substance Abuse:  No Significant Maternal Medications:  None Significant Maternal Lab Results: Group B Strep negative  Results for orders placed or performed during the hospital encounter of 04/25/20 (from the past  24 hour(s))  CBC   Collection Time: 04/25/20  6:08 PM  Result Value Ref Range   WBC 12.1 (H) 4.0 - 10.5 K/uL   RBC 4.64 3.87 - 5.11 MIL/uL   Hemoglobin 12.3 12.0 - 15.0 g/dL   HCT 38.6 36 - 46 %   MCV 83.2 80.0 - 100.0 fL   MCH 26.5 26.0 - 34.0 pg   MCHC 31.9 30.0 - 36.0 g/dL   RDW 15.3 11.5 - 15.5 %   Platelets 189 150 - 400 K/uL   nRBC 0.0 0.0 - 0.2 %  Type and screen  Collection Time: 04/25/20  6:08 PM  Result Value Ref Range   ABO/RH(D) PENDING    Antibody Screen PENDING    Sample Expiration      04/28/2020,2359 Performed at [facility], [place] [address] [place], [place] [address]     Patient Active Problem List   Diagnosis Date Noted  . Encounter for induction of labor 04/25/2020  . Trichomonal vaginitis during pregnancy 03/31/2020  . Asymptomatic bacteriuria during pregnancy 11/18/2019  . Supervision of other normal pregnancy, antepartum 09/02/2019  . Mild tobacco abuse 02/24/2018  . Substance abuse ([place]) 12/19/2017  . Anxiety and depression 07/09/2014    Assessment/Plan:  Penny Clark is a 29 y.o. G3P2002 at 2w3dhere for IOL for BPP 6/8 after 40 weeks.  #Labor: Vertex by exam. Foley bulb placed with speculum and filled with 60 mL water; patient tolerated well. Will give Cytotec. AROM/Pit PRN. Anticipate SVD. #Pain: Per patient request #FWB: Cat I; EFW: 3900g #ID:  GBS neg #MOF: Both #MOC: Nexplanon inpatient #Circ:  NA #Hx Trich: TOC ordered  CBarrington Ellison MD OB Family Medicine Fellow, [facility] 04/25/2020, 6:55 PM

## 2020-04-26 ENCOUNTER — Inpatient Hospital Stay (HOSPITAL_COMMUNITY): Payer: Medicaid Other | Admitting: Anesthesiology

## 2020-04-26 ENCOUNTER — Encounter (HOSPITAL_COMMUNITY): Payer: Self-pay | Admitting: Obstetrics & Gynecology

## 2020-04-26 DIAGNOSIS — Z3A40 40 weeks gestation of pregnancy: Secondary | ICD-10-CM

## 2020-04-26 LAB — URINE CYTOLOGY ANCILLARY ONLY
Chlamydia: NEGATIVE
Comment: NEGATIVE
Comment: NEGATIVE
Comment: NORMAL
Neisseria Gonorrhea: NEGATIVE
Trichomonas: POSITIVE — AB

## 2020-04-26 LAB — PROTEIN / CREATININE RATIO, URINE
Creatinine, Urine: 121.52 mg/dL
Protein Creatinine Ratio: 0.09 mg/mg{Cre} (ref 0.00–0.15)
Total Protein, Urine: 11 mg/dL

## 2020-04-26 LAB — SYPHILIS: RPR W/REFLEX TO RPR TITER AND TREPONEMAL ANTIBODIES, TRADITIONAL SCREENING AND DIAGNOSIS ALGORITHM: RPR Ser Ql: NONREACTIVE

## 2020-04-26 MED ORDER — WITCH HAZEL-GLYCERIN EX PADS: 1.0000 "application " | MEDICATED_PAD | CUTANEOUS | Status: DC | PRN

## 2020-04-26 MED ORDER — SENNOSIDES-DOCUSATE SODIUM 8.6-50 MG PO TABS
2.0000 | ORAL_TABLET | ORAL | Status: DC
  Filled 2020-04-26 (×2): qty 2

## 2020-04-26 MED ORDER — TERBUTALINE SULFATE 1 MG/ML IJ SOLN: 0.2500 mg | Freq: Once | INTRAMUSCULAR | Status: DC | PRN

## 2020-04-26 MED ORDER — EPHEDRINE 5 MG/ML INJ: 10.0000 mg | INTRAVENOUS | Status: DC | PRN

## 2020-04-26 MED ORDER — ONDANSETRON HCL 4 MG/2ML IJ SOLN: 4.0000 mg | INTRAMUSCULAR | Status: DC | PRN

## 2020-04-26 MED ORDER — ONDANSETRON HCL 4 MG PO TABS
4.0000 mg | ORAL_TABLET | ORAL | Status: DC | PRN
  Filled 2020-04-26: qty 1

## 2020-04-26 MED ORDER — ACETAMINOPHEN 325 MG PO TABS: 650.0000 mg | ORAL_TABLET | Freq: Four times a day (QID) | ORAL | Status: DC | PRN

## 2020-04-26 MED ORDER — BENZOCAINE-MENTHOL 20-0.5 % EX AERO: 1.0000 "application " | INHALATION_SPRAY | CUTANEOUS | Status: DC | PRN

## 2020-04-26 MED ORDER — PRENATAL MULTIVITAMIN CH
1.0000 | ORAL_TABLET | Freq: Every day | ORAL | Status: DC
  Administered 2020-04-26 – 2020-04-28 (×3): 1 via ORAL
  Filled 2020-04-26 (×3): qty 1

## 2020-04-26 MED ORDER — DIPHENHYDRAMINE HCL 50 MG/ML IJ SOLN: 12.5000 mg | INTRAMUSCULAR | Status: DC | PRN

## 2020-04-26 MED ORDER — FENTANYL-BUPIVACAINE-NACL 0.5-0.125-0.9 MG/250ML-% EP SOLN: 12.0000 mL/h | EPIDURAL | Status: DC | PRN

## 2020-04-26 MED ORDER — SODIUM CHLORIDE (PF) 0.9 % IJ SOLN
INTRAMUSCULAR | Status: DC | PRN
  Administered 2020-04-26: 12 mL/h via EPIDURAL

## 2020-04-26 MED ORDER — IBUPROFEN 600 MG PO TABS
600.0000 mg | ORAL_TABLET | Freq: Three times a day (TID) | ORAL | Status: DC | PRN
  Administered 2020-04-26 – 2020-04-28 (×6): 600 mg via ORAL
  Filled 2020-04-26 (×6): qty 1

## 2020-04-26 MED ORDER — SIMETHICONE 80 MG PO CHEW: 80.0000 mg | CHEWABLE_TABLET | ORAL | Status: DC | PRN

## 2020-04-26 MED ORDER — DIBUCAINE (PERIANAL) 1 % EX OINT: 1.0000 "application " | TOPICAL_OINTMENT | CUTANEOUS | Status: DC | PRN

## 2020-04-26 MED ORDER — COCONUT OIL OIL: 1.0000 "application " | TOPICAL_OIL | Status: DC | PRN

## 2020-04-26 MED ORDER — DIPHENHYDRAMINE HCL 25 MG PO CAPS: 25.0000 mg | ORAL_CAPSULE | Freq: Four times a day (QID) | ORAL | Status: DC | PRN

## 2020-04-26 MED ORDER — PHENYLEPHRINE 40 MCG/ML (10ML) SYRINGE FOR IV PUSH (FOR BLOOD PRESSURE SUPPORT): 80.0000 ug | PREFILLED_SYRINGE | INTRAVENOUS | Status: DC | PRN

## 2020-04-26 MED ORDER — OXYTOCIN-SODIUM CHLORIDE 30-0.9 UT/500ML-% IV SOLN
1.0000 m[IU]/min | INTRAVENOUS | Status: DC
  Administered 2020-04-26: 2 m[IU]/min via INTRAVENOUS
  Filled 2020-04-26: qty 500

## 2020-04-26 MED ORDER — FENTANYL CITRATE (PF) 100 MCG/2ML IJ SOLN
100.0000 ug | INTRAMUSCULAR | Status: DC | PRN
  Administered 2020-04-26 (×2): 100 ug via INTRAVENOUS
  Filled 2020-04-26: qty 2

## 2020-04-26 MED ORDER — METRONIDAZOLE 500 MG PO TABS
2000.0000 mg | ORAL_TABLET | Freq: Every day | ORAL | Status: DC
  Administered 2020-04-26 – 2020-04-28 (×3): 2000 mg via ORAL
  Filled 2020-04-26 (×4): qty 4

## 2020-04-26 MED ORDER — FENTANYL-BUPIVACAINE-NACL 0.5-0.125-0.9 MG/250ML-% EP SOLN
12.0000 mL/h | EPIDURAL | Status: DC | PRN
  Filled 2020-04-26: qty 250

## 2020-04-26 MED ORDER — TETANUS-DIPHTH-ACELL PERTUSSIS 5-2.5-18.5 LF-MCG/0.5 IM SUSP: 0.5000 mL | Freq: Once | INTRAMUSCULAR | Status: DC

## 2020-04-26 MED ORDER — FENTANYL CITRATE (PF) 100 MCG/2ML IJ SOLN
INTRAMUSCULAR | Status: AC
  Filled 2020-04-26: qty 2

## 2020-04-26 MED ORDER — METRONIDAZOLE 500 MG PO TABS
500.0000 mg | ORAL_TABLET | Freq: Two times a day (BID) | ORAL | Status: DC
  Filled 2020-04-26 (×2): qty 1

## 2020-04-26 MED ORDER — ALBUTEROL SULFATE (2.5 MG/3ML) 0.083% IN NEBU: 2.5000 mg | INHALATION_SOLUTION | Freq: Four times a day (QID) | RESPIRATORY_TRACT | Status: DC | PRN

## 2020-04-26 MED ORDER — LACTATED RINGERS IV SOLN: 500.0000 mL | Freq: Once | INTRAVENOUS | Status: DC

## 2020-04-26 MED ORDER — DIPHENHYDRAMINE HCL 50 MG/ML IJ SOLN
12.5000 mg | INTRAMUSCULAR | Status: DC | PRN
  Administered 2020-04-26 (×2): 12.5 mg via INTRAVENOUS
  Filled 2020-04-26: qty 1

## 2020-04-26 MED ORDER — MEASLES, MUMPS & RUBELLA VAC IJ SOLR: 0.5000 mL | Freq: Once | INTRAMUSCULAR | Status: DC

## 2020-04-26 NOTE — Anesthesia Preprocedure Evaluation (Signed)
Anesthesia Evaluation  Patient identified by MRN, date of birth, ID band Patient awake    Reviewed: Allergy & Precautions, H&P , NPO status , Patient's Chart, lab work & pertinent test results, reviewed documented beta blocker date and time   Airway Mallampati: II  TM Distance: >3 FB Neck ROM: full    Dental no notable dental hx.    Pulmonary neg pulmonary ROS, former smoker,    Pulmonary exam normal breath sounds clear to auscultation       Cardiovascular negative cardio ROS Normal cardiovascular exam Rhythm:regular Rate:Normal     Neuro/Psych PSYCHIATRIC DISORDERS Anxiety Depression negative neurological ROS     GI/Hepatic negative GI ROS, Neg liver ROS,   Endo/Other  Morbid obesity  Renal/GU negative Renal ROS  negative genitourinary   Musculoskeletal   Abdominal   Peds  Hematology  (+) Blood dyscrasia, anemia ,   Anesthesia Other Findings   Reproductive/Obstetrics (+) Pregnancy                             Anesthesia Physical Anesthesia Plan  ASA: II  Anesthesia Plan: Epidural   Post-op Pain Management:    Induction:   PONV Risk Score and Plan:   Airway Management Planned:   Additional Equipment:   Intra-op Plan:   Post-operative Plan:   Informed Consent: I have reviewed the patients History and Physical, chart, labs and discussed the procedure including the risks, benefits and alternatives for the proposed anesthesia with the patient or authorized representative who has indicated his/her understanding and acceptance.       Plan Discussed with:   Anesthesia Plan Comments:         Anesthesia Quick Evaluation

## 2020-04-26 NOTE — Progress Notes (Signed)
Penny Clark is a 29 y.o. G3P2002 at [redacted]w[redacted]d admitted for IOL for BPP 6/8 after 40 weeks.  Subjective: Starting to ask for pain medicine   Objective: BP (!) 152/78   Pulse 75   Temp 98 F (36.7 C) (Oral)   Resp 18   Ht 5\' 5"  (1.651 m)   Wt 47.9 kg   LMP 07/17/2019 (Approximate)   SpO2 97%   BMI 17.57 kg/m  No intake/output data recorded.  FHT:  FHR: 125 bpm, variability: moderate,  accelerations:  Present,  decelerations:  Absent UC:   Difficult to pick up on TOCO  SVE:   Dilation: 4 Effacement (%): 50 Station: -3 Exam by:: [email or web address], RNC  Labs: Lab Results  Component Value Date   WBC 12.1 (H) 04/25/2020   HGB 12.3 04/25/2020   HCT 38.6 04/25/2020   MCV 83.2 04/25/2020   PLT 189 04/25/2020    Assessment / Plan: Penny Clark a 29 y.o.G3P2002 at [redacted]w[redacted]d here for IOL for BPP 6/8 after 40 weeks.  #Labor:S/p Fb, cyto x2. Cervix soft, but very posterior. Will start pitocin at this time. Consider AROM as appropriate. #Pain:Per patient request #FWB:Cat I; EFW:3900g #ID:GBS neg #MOF:Both #MOC:Nexplanon inpatient #Circ:NA #Hx Trich: TOC ordered #Elevated BP: last 2 BP systolic 150s. No symptoms of Pre-E, Pre-E labs pending.  8/8 DO OB Fellow, Faculty Practice 04/26/2020, 3:16 AM

## 2020-04-26 NOTE — Progress Notes (Addendum)
Patient ID: Penny Clark, female   DOB: Jun 15, 1991, 29 y.o.   MRN: [id number]   Discussed positive trichomonas testing with [name].  She reports taking the full course of metronidazole as prescribed twice this pregnancy.  She is frustrated this is still positive.  Her partner/FOB has not been treated, but they have not been sexually active since she first found out about the positive test.  Discussed treating with higher dose metronidazole 2 g daily x7 days.  Strongly encouraged her partner see PCP or go to health department for treatment.  If TOC still positive after this treatment, will send culture and drug susceptibilities.

## 2020-04-26 NOTE — Discharge Summary (Signed)
Postpartum Discharge Summary      Patient Name: Penny Clark DOB: Sep 12, 1991 MRN: [id number]  Date of admission: 04/25/2020 Delivery date:04/26/2020  Delivering provider: Chauncey Mann  Date of discharge: 04/28/2020  Admitting diagnosis: Encounter for induction of labor [Z34.90] Intrauterine pregnancy: [redacted]w[redacted]d    Secondary diagnosis:  Active Problems:   Anxiety and depression   Substance abuse ([place]   Trichomonal vaginitis during pregnancy   Encounter for induction of labor   Insertion of implantable subdermal contraceptive  Additional problems: None    Discharge diagnosis: Term Pregnancy Delivered                                              Post partum procedures:Nexplanon insertion Augmentation: Pitocin, Cytotec and IP Foley Complications: None  Hospital course: Induction of Labor With Vaginal Delivery   29y.o. yo G3P2002 at 421w4das admitted to the hospital 04/25/2020 for induction of labor.  Indication for induction: BPP 6/8 after 40 weeks.  Patient had an uncomplicated labor course as follows: Membrane Rupture Time/Date: 10:13 AM ,04/26/2020   Delivery Method:Vaginal, Spontaneous  Episiotomy: None  Lacerations:  None  Details of delivery can be found in separate delivery note.  Patient had a routine postpartum course. Nexplanon placed on PPD #2. Patient is discharged home 04/28/20.  Newborn Data: Birth date:04/26/2020  Birth time:11:55 AM  Gender:Female  Living status:Living  Apgars:8 ,9  [phone]   Magnesium Sulfate received: No BMZ received: No Rhophylac:N/A MMR:N/A T-DaP:Given prenatally Flu: No Transfusion:No  Physical exam  Vitals:   04/27/20 0358 04/27/20 1411 04/27/20 2138 04/28/20 0630  BP: 123/84 130/85 127/81 128/65  Pulse: 78 80 75 80  Resp: _0 Temp: 97.8 F (36.6 C) 98.3 F (36.8 C) 98.2 F (36.8 C) 98 F (36.7 C)  TempSrc: Oral Oral Oral Oral  SpO2: 100% 100% 99% 100%  Weight:      Height:       General: alert,  cooperative and no distress Lochia: appropriate Uterine Fundus: firm Incision: N/A DVT Evaluation: No evidence of DVT seen on physical exam. Labs: Lab Results  Component Value Date   WBC 12.1 (H) 04/25/2020   HGB 12.3 04/25/2020   HCT 38.6 04/25/2020   MCV 83.2 04/25/2020   PLT 189 04/25/2020   CMP Latest Ref Rng & Units 04/25/2020  Glucose 70 - 99 mg/dL 131(H)  BUN 6 - 20 mg/dL 8  Creatinine 0.44 - 1.00 mg/dL 0.67  Sodium 135 - 145 mmol/L 137  Potassium 3.5 - 5.1 mmol/L 3.8  Chloride 98 - 111 mmol/L 106  CO2 22 - 32 mmol/L 19(L)  Calcium 8.9 - 10.3 mg/dL 8.7(L)  Total Protein 6.5 - 8.1 g/dL 6.2(L)  Total Bilirubin 0.3 - 1.2 mg/dL 0.7  Alkaline Phos 38 - 126 U/L 143(H)  AST 15 - 41 U/L 33  ALT 0 - 44 U/L 18   Edinburgh Score: Edinburgh Postnatal Depression Scale Screening Tool 04/26/2020  I have been able to laugh and see the funny side of things. 0  I have looked forward with enjoyment to things. 0  I have blamed myself unnecessarily when things went wrong. 1  I have been anxious or worried for no good reason. 1  I have felt scared or panicky for no good reason. 0  Things have been getting on top of  me. 1  I have been so unhappy that I have had difficulty sleeping. 0  I have felt sad or miserable. 0  I have been so unhappy that I have been crying. 0  The thought of harming myself has occurred to me. 0  Edinburgh Postnatal Depression Scale Total 3     After visit meds:  Allergies as of 04/28/2020      Reactions   Bee Venom Swelling   Says "not terribly bad", no SOB, has never carried Epipen Says "not terribly bad", no SOB, has never carried Epipen   Keflex [cephalexin] Hives, Itching, Rash      Medication List    STOP taking these medications   diphenhydrAMINE 25 MG tablet Commonly known as: BENADRYL   guaiFENesin 600 MG 12 hr tablet Commonly known as: Mucinex   sodium chloride 0.65 % Soln nasal spray Commonly known as: OCEAN     TAKE these medications    acetaminophen 325 MG tablet Commonly known as: Tylenol Take 2 tablets (650 mg total) by mouth every 6 (six) hours as needed (for pain scale < 4).   albuterol 108 (90 Base) MCG/ACT inhaler Commonly known as: VENTOLIN HFA Inhale 1-2 puffs into the lungs every 6 (six) hours as needed for wheezing.   Blood Pressure Kit Use as directed   Blood Pressure Monitor/L Cuff Misc Use as directed   CitraNatal 90 DHA 90-1 & 300 MG Misc Take 1 tablet by mouth daily.   ibuprofen 600 MG tablet Commonly known as: ADVIL Take 1 tablet (600 mg total) by mouth every 8 (eight) hours as needed for mild pain.   metroNIDAZOLE 500 MG tablet Commonly known as: FLAGYL Take 4 tablets (2,000 mg total) by mouth daily for 5 days. What changed:   how much to take  when to take this        Discharge home in stable condition Infant Feeding: Breast Infant Disposition:home with mother Discharge instruction: per After Visit Summary and Postpartum booklet. Activity: Advance as tolerated. Pelvic rest for 6 weeks.  Diet: routine diet Future Appointments: Future Appointments  Date Time Provider [place]  05/25/2020 11:00 AM Constant, Vickii Chafe, MD [place] None   Follow up Visit:   Please schedule this patient for a Virtual postpartum visit in 4 weeks with the following provider: Any provider. Additional Postpartum F/U:none  Low risk pregnancy complicated by: none Delivery mode:  Vaginal, Spontaneous  Anticipated Birth Control:  Nexplanon   04/28/2020 Jamerson Vonbargen L Clarinda Obi, DO

## 2020-04-26 NOTE — Anesthesia Procedure Notes (Signed)
Epidural Patient location during procedure: OB Start time: 04/26/2020 3:56 AM End time: 04/26/2020 4:00 AM  Staffing Anesthesiologist: Bethena Midget, MD  Preanesthetic Checklist Completed: patient identified, IV checked, site marked, risks and benefits discussed, surgical consent, monitors and equipment checked, pre-op evaluation and timeout performed  Epidural Patient position: sitting Prep: DuraPrep and site prepped and draped Patient monitoring: continuous pulse ox and blood pressure Approach: midline Location: L3-L4 Injection technique: LOR air  Needle:  Needle type: Tuohy  Needle gauge: 17 G Needle length: 9 cm and 9 Needle insertion depth: 8 cm Catheter type: closed end flexible Catheter size: 19 Gauge Catheter at skin depth: 13 cm Test dose: negative  Assessment Events: blood not aspirated, injection not painful, no injection resistance, no paresthesia and negative IV test

## 2020-04-27 ENCOUNTER — Other Ambulatory Visit (HOSPITAL_COMMUNITY): Payer: Medicaid Other

## 2020-04-27 NOTE — Progress Notes (Addendum)
[name] was referred for history of depression/anxiety. * Referral screened out by Clinical Social Worker because none of the following criteria appear to apply: ~ History of anxiety/depression during this pregnancy, or of post-partum depression following prior delivery. ~ Diagnosis of anxiety and/or depression within last 3 years. Per further chart review, it appears that [name] was diagnose with anxiety in 2015.  OR * [name]'s symptoms currently being treated with medication and/or therapy.   CSW aware that [name] scored 3 on Edinburgh with no concerns to this CSW. CSW also noted that [name] has no substance use during this pregnancy.      Penny Clark, MSW, LCSW [facility] [phone]

## 2020-04-27 NOTE — Anesthesia Postprocedure Evaluation (Signed)
Anesthesia Post Note  Patient: Penny Clark  Procedure(s) Performed: AN AD HOC LABOR EPIDURAL     Patient location during evaluation: Mother Baby Anesthesia Type: Epidural Level of consciousness: awake, awake and alert and oriented Pain management: pain level controlled Vital Signs Assessment: post-procedure vital signs reviewed and stable Respiratory status: spontaneous breathing and respiratory function stable Cardiovascular status: blood pressure returned to baseline Postop Assessment: no headache, epidural receding, patient able to bend at knees, adequate PO intake, no backache, no apparent nausea or vomiting and able to ambulate Anesthetic complications: no   No complications documented.  Last Vitals:  Vitals:   04/27/20 0000 04/27/20 0358  BP: 104/62 123/84  Pulse: 76 78  Resp: 16 16  Temp: 36.6 C 36.6 C  SpO2: 99% 100%    Last Pain:  Vitals:   04/27/20 0358  TempSrc: Oral  PainSc: 3    Pain Goal:                   Cleda Clarks

## 2020-04-28 ENCOUNTER — Encounter: Payer: Medicaid Other | Admitting: Obstetrics & Gynecology

## 2020-04-28 DIAGNOSIS — Z30017 Encounter for initial prescription of implantable subdermal contraceptive: Secondary | ICD-10-CM

## 2020-04-28 MED ORDER — METRONIDAZOLE 500 MG PO TABS: 2000.0000 mg | ORAL_TABLET | Freq: Every day | ORAL | 0 refills | Status: AC

## 2020-04-28 MED ORDER — ETONOGESTREL 68 MG ~~LOC~~ IMPL
68.0000 mg | DRUG_IMPLANT | Freq: Once | SUBCUTANEOUS | Status: AC
  Administered 2020-04-28: 68 mg via SUBCUTANEOUS
  Filled 2020-04-28: qty 1

## 2020-04-28 MED ORDER — IBUPROFEN 600 MG PO TABS: 600.0000 mg | ORAL_TABLET | Freq: Three times a day (TID) | ORAL | 0 refills | Status: AC | PRN

## 2020-04-28 MED ORDER — LIDOCAINE HCL 1 % IJ SOLN
0.0000 mL | Freq: Once | INTRAMUSCULAR | Status: DC | PRN
  Filled 2020-04-28: qty 20

## 2020-04-28 MED ORDER — ACETAMINOPHEN 325 MG PO TABS: 650.0000 mg | ORAL_TABLET | Freq: Four times a day (QID) | ORAL | 0 refills | Status: AC | PRN

## 2020-04-28 NOTE — Procedures (Signed)
Post-Placental Nexplanon Insertion Procedure Note  Patient was identified. Informed consent was signed, signed copy in chart. A time-out was performed.    The insertion site was identified 8-10 cm (3-4 inches) from the medial epicondyle of the humerus and 3-5 cm (1.25-2 inches) posterior to (below) the sulcus (groove) between the biceps and triceps muscles of the patient's right arm and marked. The site was prepped and draped in the usual sterile fashion. Pt was prepped with alcohol swab and then injected with 3 cc of 1% lidocaine. The site was prepped with betadine. Nexplanon removed form packaging,  Device confirmed in needle, then inserted full length of needle and withdrawn per handbook instructions. Provider and patient verified presence of the implant in the woman's arm by palpation. Pt insertion site was covered with steristrips/adhesive bandage and pressure bandage. There was minimal blood loss. Patient tolerated procedure well.  Patient was given post procedure instructions and Nexplanon user card with expiration date. Condoms were recommended for STI prevention. Patient was asked to keep the pressure dressing on for 24 hours to minimize bruising and keep the adhesive bandage on for 3-5 days. The patient verbalized understanding of the plan of care and agrees.   Lot # [id number] Expiration Date 08/01/2022  Marlowe Alt, DO OB Fellow, [facility] 04/28/2020 6:29 AM

## 2020-04-28 NOTE — Discharge Instructions (Signed)

## 2020-04-28 NOTE — Progress Notes (Signed)
POSTPARTUM PROGRESS NOTE  Subjective: Penny Clark is a 29 y.o. [id number] s/p SVD at [redacted]w[redacted]d.  She and baby were sleeping soundly, so I spoke to the RN regarding her progress.. No acute events overnight. She has been ambulating, voiding and tolerating solids well. No reports of nausea/vomiting.  Pain is well controlled.  Lochia is minimal.  Objective: Blood pressure 128/65, pulse 80, temperature 98 F (36.7 C), temperature source Oral, resp. rate 18, height 5\' 5"  (1.651 m), weight 107 kg, last menstrual period 07/17/2019, SpO2 100 %, unknown if currently breastfeeding.  Physical Exam:  General: soundly asleep on her stomach, baby at bedside in bassinet. No respiratory distress, no reports of excessive lochia, no calf swelling.  Recent Labs    04/25/20 1808  HGB 12.3  HCT 38.6    Assessment/Plan: Penny Clark is a 29 y.o. 37 s/p SVD at [redacted]w[redacted]d for IOL for BPP 6/8 post-dates.  Routine Postpartum Care: Doing well, pain well-controlled.  -- Continue routine care, lactation support  -- Contraception: Nexplanon (RN to inquire if she wants it placed inpatient) -- Feeding: br/bo  Dispo: Plan for discharge tomorrow.  8/8, CNM [facility]

## 2020-04-29 ENCOUNTER — Inpatient Hospital Stay (HOSPITAL_COMMUNITY)
Admission: AD | Admit: 2020-04-29 | Payer: Medicaid Other | Source: Home / Self Care | Admitting: Obstetrics & Gynecology

## 2020-04-29 ENCOUNTER — Inpatient Hospital Stay (HOSPITAL_COMMUNITY): Payer: Medicaid Other

## 2020-05-25 ENCOUNTER — Telehealth: Payer: Medicaid Other | Admitting: Obstetrics and Gynecology

## 2020-05-31 ENCOUNTER — Encounter: Payer: Self-pay | Admitting: Obstetrics and Gynecology

## 2020-05-31 ENCOUNTER — Ambulatory Visit (INDEPENDENT_AMBULATORY_CARE_PROVIDER_SITE_OTHER): Payer: Medicaid Other | Admitting: Obstetrics and Gynecology

## 2020-05-31 ENCOUNTER — Other Ambulatory Visit: Payer: Self-pay

## 2020-05-31 ENCOUNTER — Other Ambulatory Visit (HOSPITAL_COMMUNITY)
Admission: RE | Admit: 2020-05-31 | Discharge: 2020-05-31 | Disposition: A | Discharge status: Home / Self Care | Payer: Medicaid Other | Source: Ambulatory Visit | Attending: Obstetrics and Gynecology | Admitting: Obstetrics and Gynecology

## 2020-05-31 VITALS — BP 105/72 | HR 92 | Wt 222.1 lb

## 2020-05-31 DIAGNOSIS — A5901 Trichomonal vulvovaginitis: Secondary | ICD-10-CM | POA: Diagnosis present

## 2020-05-31 DIAGNOSIS — O23599 Infection of other part of genital tract in pregnancy, unspecified trimester: Secondary | ICD-10-CM | POA: Insufficient documentation

## 2020-05-31 DIAGNOSIS — Z30017 Encounter for initial prescription of implantable subdermal contraceptive: Secondary | ICD-10-CM

## 2020-05-31 NOTE — Progress Notes (Signed)
.     Post Partum Visit Note  Penny Clark is a 29 y.o. [id number] female who presents for a postpartum visit. She is 5 weeks postpartum following a normal spontaneous vaginal delivery, moulding/caput. I have fully reviewed the prenatal and intrapartum course. The delivery was at [redacted]w[redacted]d gestational weeks.  Anesthesia: epidural. Postpartum course has been unremarkable. Baby is doing well. Baby is feeding by both breast and bottle - Gentle Gerber. Bleeding normal period flow. Bowel function is normal. Bladder function is normal. Patient is not sexually active. Contraception method is Nexplanon. Postpartum depression screening: negative. EPDS= 4   The pregnancy intention screening data noted above was reviewed. Potential methods of contraception were discussed. The patient elected to proceed with Hormonal Implant.      The following portions of the patient's history were reviewed and updated as appropriate: allergies, current medications, past family history, past medical history, past social history, past surgical history and problem list.  Review of Systems Pertinent items noted in HPI and remainder of comprehensive ROS otherwise negative.    Objective:  LMP - recent pregnant. Pt is breast feeding and supplementing with Formula.  General:  alert   Breasts:  not evaluated  Lungs: clear to auscultation bilaterally  Heart:  regular rate and rhythm, S1, S2 normal, no murmur, click, rub or gallop  Abdomen: soft, non-tender; bowel sounds normal; no masses,  no organomegaly   Vulva:  not evaluated  Vagina: not evaluated  Cervix:  not evaluated  Corpus: not examined  Adnexa:  not evaluated  Rectal Exam: Not performed.        Assessment:    NL postpartum exam. Pap smear not done at today's visit.   Plan:   Essential components of care per ACOG recommendations:  1.  Mood and well being: Patient with negative depression screening today. Reviewed local resources for support.  - Patient does  not use tobacco.  - hx of drug use? Yes   If yes, discussed support systems  2. Infant care and feeding:  -Patient currently breastmilk feeding? Yes If breastmilk feeding discussed return to work and pumping. If needed, patient was provided letter for work to allow for every 2-3 hr pumping breaks, and to be granted a private location to express breastmilk and refrigerated area to store breastmilk. Reviewed importance of draining breast regularly to support lactation. -Social determinants of health (SDOH) reviewed in EPIC. No concerns  3. Sexuality, contraception and birth spacing - Patient does not want a pregnancy in the next year.  Desired family size is uncertain children.  - Reviewed forms of contraception in tiered fashion. Patient desired Implanon today.   - Discussed birth spacing of 18 months  4. Sleep and fatigue -Encouraged family/partner/community support of 4 hrs of uninterrupted sleep to help with mood and fatigue  5. Physical Recovery  - Discussed patients delivery and no complications - Patient had a no degree laceration, perineal healing reviewed. Patient expressed understanding - Patient has urinary incontinence? No  - Patient is safe to resume physical and sexual activity  6.  Health Maintenance - Last pap smear done 01/16/2018 and was normal with negative HPV.  7. noChronic Disease - PCP follow up   Nettie Elm, MD [facility], [facility]

## 2020-05-31 NOTE — Patient Instructions (Signed)
Health Maintenance, Female Adopting a healthy lifestyle and getting preventive care are important in promoting health and wellness. Ask your health care provider about:  The right schedule for you to have regular tests and exams.  Things you can do on your own to prevent diseases and keep yourself healthy. What should I know about diet, weight, and exercise? Eat a healthy diet   Eat a diet that includes plenty of vegetables, fruits, low-fat dairy products, and lean protein.  Do not eat a lot of foods that are high in solid fats, added sugars, or sodium. Maintain a healthy weight Body mass index (BMI) is used to identify weight problems. It estimates body fat based on height and weight. Your health care provider can help determine your BMI and help you achieve or maintain a healthy weight. Get regular exercise Get regular exercise. This is one of the most important things you can do for your health. Most adults should:  Exercise for at least 150 minutes each week. The exercise should increase your heart rate and make you sweat (moderate-intensity exercise).  Do strengthening exercises at least twice a week. This is in addition to the moderate-intensity exercise.  Spend less time sitting. Even light physical activity can be beneficial. Watch cholesterol and blood lipids Have your blood tested for lipids and cholesterol at 30 years of age, then have this test every 5 years. Have your cholesterol levels checked more often if:  Your lipid or cholesterol levels are high.  You are older than 30 years of age.  You are at high risk for heart disease. What should I know about cancer screening? Depending on your health history and family history, you may need to have cancer screening at various ages. This may include screening for:  Breast cancer.  Cervical cancer.  Colorectal cancer.  Skin cancer.  Lung cancer. What should I know about heart disease, diabetes, and high blood  pressure? Blood pressure and heart disease  High blood pressure causes heart disease and increases the risk of stroke. This is more likely to develop in people who have high blood pressure readings, are of African descent, or are overweight.  Have your blood pressure checked: ? Every 3-5 years if you are 18-39 years of age. ? Every year if you are 40 years old or older. Diabetes Have regular diabetes screenings. This checks your fasting blood sugar level. Have the screening done:  Once every three years after age 40 if you are at a normal weight and have a low risk for diabetes.  More often and at a younger age if you are overweight or have a high risk for diabetes. What should I know about preventing infection? Hepatitis B If you have a higher risk for hepatitis B, you should be screened for this virus. Talk with your health care provider to find out if you are at risk for hepatitis B infection. Hepatitis C Testing is recommended for:  Everyone born from 1945 through 1965.  Anyone with known risk factors for hepatitis C. Sexually transmitted infections (STIs)  Get screened for STIs, including gonorrhea and chlamydia, if: ? You are sexually active and are younger than 30 years of age. ? You are older than 30 years of age and your health care provider tells you that you are at risk for this type of infection. ? Your sexual activity has changed since you were last screened, and you are at increased risk for chlamydia or gonorrhea. Ask your health care provider if   you are at risk.  Ask your health care provider about whether you are at high risk for HIV. Your health care provider may recommend a prescription medicine to help prevent HIV infection. If you choose to take medicine to prevent HIV, you should first get tested for HIV. You should then be tested every 3 months for as long as you are taking the medicine. Pregnancy  If you are about to stop having your period (premenopausal) and  you may become pregnant, seek counseling before you get pregnant.  Take 400 to 800 micrograms (mcg) of folic acid every day if you become pregnant.  Ask for birth control (contraception) if you want to prevent pregnancy. Osteoporosis and menopause Osteoporosis is a disease in which the bones lose minerals and strength with aging. This can result in bone fractures. If you are 65 years old or older, or if you are at risk for osteoporosis and fractures, ask your health care provider if you should:  Be screened for bone loss.  Take a calcium or vitamin D supplement to lower your risk of fractures.  Be given hormone replacement therapy (HRT) to treat symptoms of menopause. Follow these instructions at home: Lifestyle  Do not use any products that contain nicotine or tobacco, such as cigarettes, e-cigarettes, and chewing tobacco. If you need help quitting, ask your health care provider.  Do not use street drugs.  Do not share needles.  Ask your health care provider for help if you need support or information about quitting drugs. Alcohol use  Do not drink alcohol if: ? Your health care provider tells you not to drink. ? You are pregnant, may be pregnant, or are planning to become pregnant.  If you drink alcohol: ? Limit how much you use to 0-1 drink a day. ? Limit intake if you are breastfeeding.  Be aware of how much alcohol is in your drink. In the [place] one drink equals one 12 oz bottle of beer (355 mL), one 5 oz glass of wine (148 mL), or one 1 oz glass of hard liquor (44 mL). General instructions  Schedule regular health, dental, and eye exams.  Stay current with your vaccines.  Tell your health care provider if: ? You often feel depressed. ? You have ever been abused or do not feel safe at home. Summary  Adopting a healthy lifestyle and getting preventive care are important in promoting health and wellness.  Follow your health care provider's instructions about healthy  diet, exercising, and getting tested or screened for diseases.  Follow your health care provider's instructions on monitoring your cholesterol and blood pressure. This information is not intended to replace advice given to you by your health care provider. Make sure you discuss any questions you have with your health care provider. Document Revised: 10/15/2018 Document Reviewed: 10/15/2018 Elsevier Patient Education  2020 Elsevier Inc.  

## 2020-06-01 LAB — CERVICOVAGINAL ANCILLARY ONLY
Comment: NEGATIVE
Trichomonas: NEGATIVE

## 2021-01-02 ENCOUNTER — Other Ambulatory Visit (HOSPITAL_COMMUNITY)
Admission: RE | Admit: 2021-01-02 | Discharge: 2021-01-02 | Disposition: A | Discharge status: Home / Self Care | Payer: Medicaid Other | Source: Ambulatory Visit | Attending: Obstetrics and Gynecology | Admitting: Obstetrics and Gynecology

## 2021-01-02 ENCOUNTER — Encounter: Payer: Self-pay | Admitting: Obstetrics and Gynecology

## 2021-01-02 ENCOUNTER — Other Ambulatory Visit: Payer: Self-pay

## 2021-01-02 ENCOUNTER — Ambulatory Visit (INDEPENDENT_AMBULATORY_CARE_PROVIDER_SITE_OTHER): Payer: Medicaid Other | Admitting: Obstetrics and Gynecology

## 2021-01-02 VITALS — BP 144/79 | HR 93 | Ht 65.0 in | Wt 203.0 lb

## 2021-01-02 DIAGNOSIS — Z975 Presence of (intrauterine) contraceptive device: Secondary | ICD-10-CM

## 2021-01-02 DIAGNOSIS — N760 Acute vaginitis: Secondary | ICD-10-CM | POA: Insufficient documentation

## 2021-01-02 DIAGNOSIS — Z124 Encounter for screening for malignant neoplasm of cervix: Secondary | ICD-10-CM | POA: Diagnosis not present

## 2021-01-02 DIAGNOSIS — N921 Excessive and frequent menstruation with irregular cycle: Secondary | ICD-10-CM | POA: Diagnosis not present

## 2021-01-02 NOTE — Progress Notes (Signed)
30 yo P2 with LMP 12/10/20 and BMI 33 who is here for evaluation of irregular bleeding. Patient is sexually active using Nexplanon for contraception. Patient reports a history of amenorrhea with occasional light bleeding around the time of her menses. Earlier this month, she described the onset of a normal period followed by several 2-3 weeks of continued heavy bleeding. Patient reports bleeding stopped 12/31/20. She denies pelvic pain. Patient has had Nexplanon in place since June 2021  Past Medical History:  Diagnosis Date  . Anemia   . Breastfeeding (infant) 07/16/2013  . Cocaine abuse (HCC)   . Normal delivery 07/16/2013   Past Surgical History:  Procedure Laterality Date  . NO PAST SURGERIES     Family History  Problem Relation Age of Onset  . Cancer Maternal Grandfather    Social History   Tobacco Use  . Smoking status: Former Smoker    Packs/day: 0.50    Types: Cigarettes, E-cigarettes    Quit date: 10/31/2012    Years since quitting: 8.1  . Smokeless tobacco: Never Used  Vaping Use  . Vaping Use: Former  Substance Use Topics  . Alcohol use: Not Currently    Comment: social-before pregnancy  . Drug use: Not Currently    Types: Cocaine    Comment: Pt reports last cocaine use was 12/12/2017.   ROS See pertinent in HPI. All other systems reviewed and non contributory  Blood pressure (!) 144/79, pulse 93, height 5\' 5"  (1.651 m), weight 203 lb (92.1 kg), last menstrual period 12/10/2020, unknown if currently breastfeeding. GENERAL: Well-developed, well-nourished female in no acute distress.  HEENT: Normocephalic, atraumatic. Sclerae anicteric.  NECK: Supple. Normal thyroid.  LUNGS: Clear to auscultation bilaterally.  HEART: Regular rate and rhythm. BREASTS: Symmetric in size. No palpable masses or lymphadenopathy, skin changes, or nipple drainage. ABDOMEN: Soft, nontender, nondistended. No organomegaly. PELVIC: Normal external female genitalia. Vagina is pink and rugated.   Normal discharge. Normal appearing cervix. Uterus is normal in size.  No adnexal mass or tenderness. EXTREMITIES: No cyanosis, clubbing, or edema, 2+ distal pulses.  A/P 30 yo with likely breakthrough bleeding associated with Nexplanon - Patient advised to keep a menstrual calendar. May need a trial of OCP if this becomes a persistent pattern - Vaginal swab collected to rule out infectious etiology of bleeding - Pap smear also collected - Patient will be contacted with abnormal results - RTC prn

## 2021-01-02 NOTE — Progress Notes (Addendum)
GYN presents for vaginal bleeding, she bled for 4 weeks Feb. 5-26, bleeding has stopped.   Last PAP 01/16/2018

## 2021-01-03 LAB — CERVICOVAGINAL ANCILLARY ONLY
Bacterial Vaginitis (gardnerella): NEGATIVE
Candida Glabrata: NEGATIVE
Candida Vaginitis: NEGATIVE
Chlamydia: NEGATIVE
Comment: NEGATIVE
Comment: NEGATIVE
Comment: NEGATIVE
Comment: NEGATIVE
Comment: NEGATIVE
Comment: NORMAL
Neisseria Gonorrhea: NEGATIVE
Trichomonas: POSITIVE — AB

## 2021-01-03 MED ORDER — METRONIDAZOLE 500 MG PO TABS: 500.0000 mg | ORAL_TABLET | Freq: Two times a day (BID) | ORAL | 0 refills | Status: AC

## 2021-01-03 NOTE — Addendum Note (Signed)
Addended by: Catalina Antigua on: 01/03/2021 07:17 PM   Modules accepted: Orders

## 2021-01-05 LAB — CYTOLOGY - PAP
Comment: NEGATIVE
Diagnosis: NEGATIVE
High risk HPV: NEGATIVE

## 2021-11-19 IMAGING — US US MFM OB FOLLOW-UP
1 series · 13 of 28 positions shown · non-contrast
Comparison: none

[Series 1: us mfm ob follow-up · 13 of 83 slices shown]
[im 4/83]
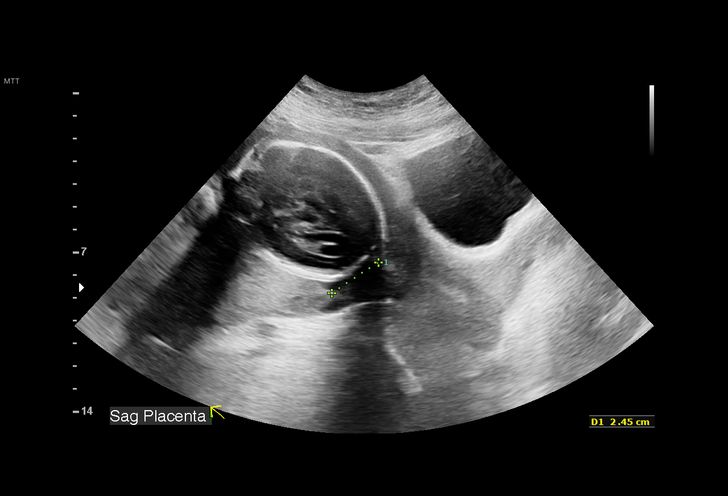
[im 10/83]
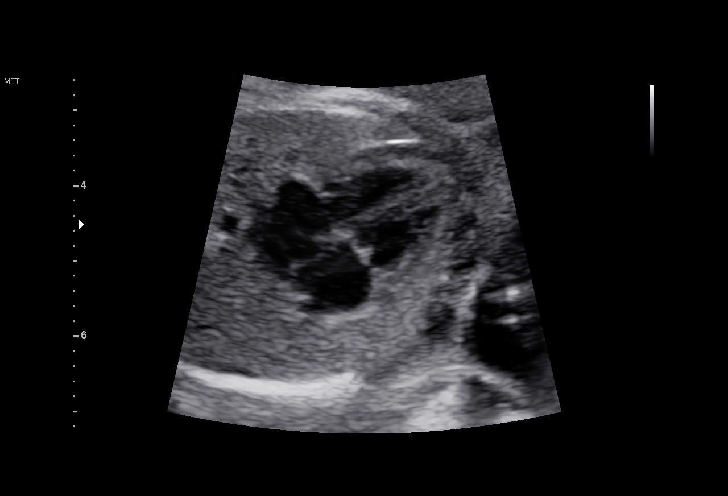
[im 16/83]
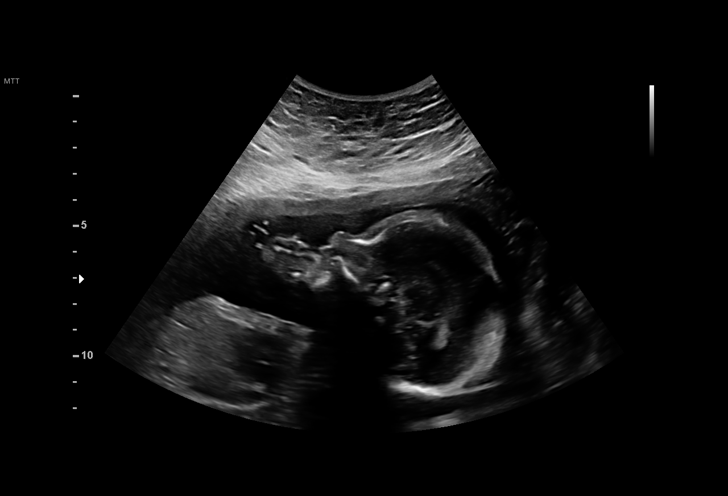
[im 22/83]
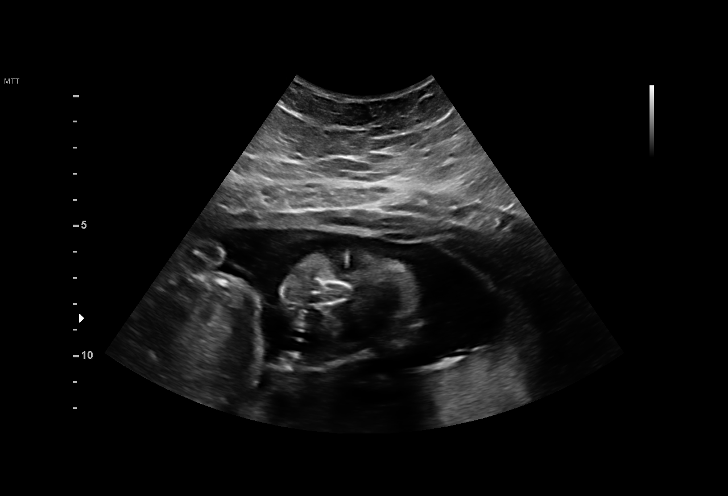
[im 28/83]
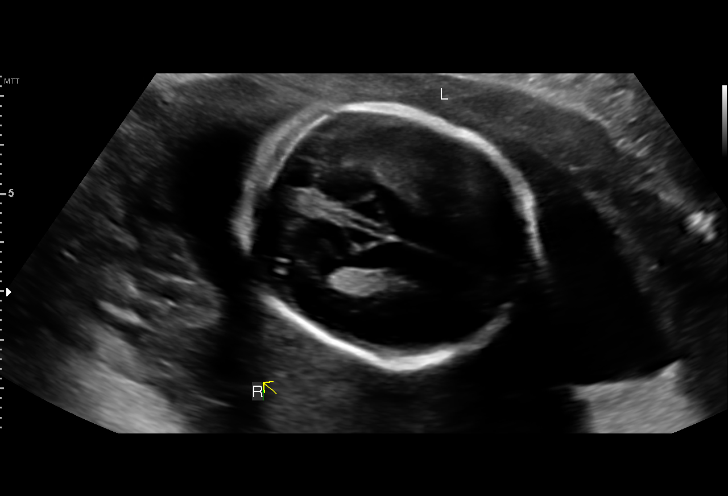
[im 34/83]
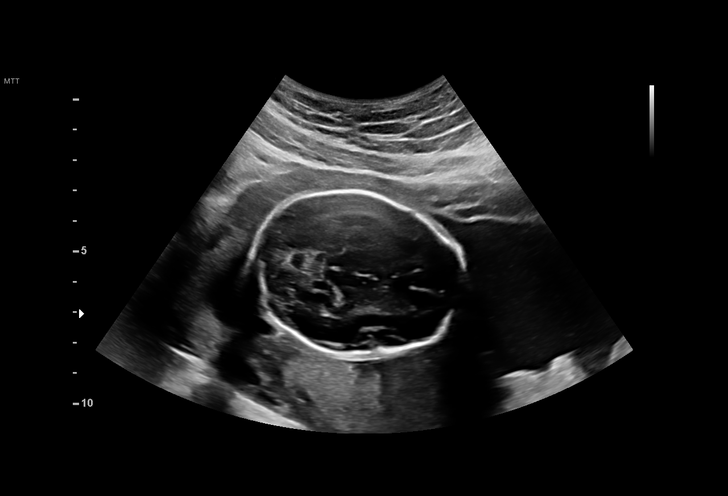
[im 43/83]
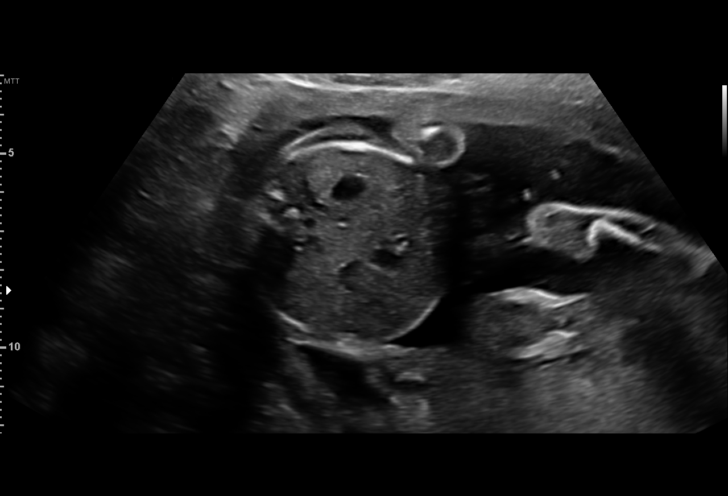
[im 49/83]
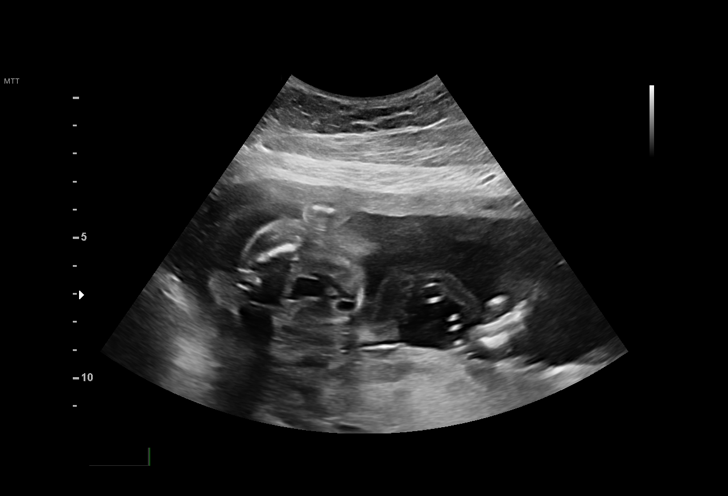
[im 55/83]
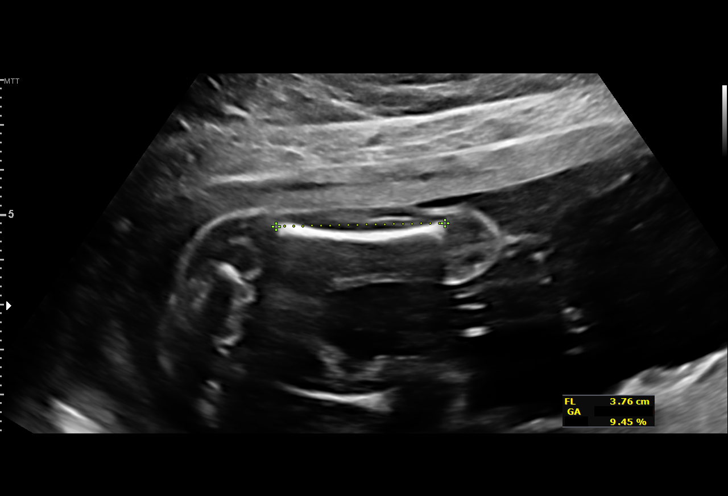
[im 61/83]
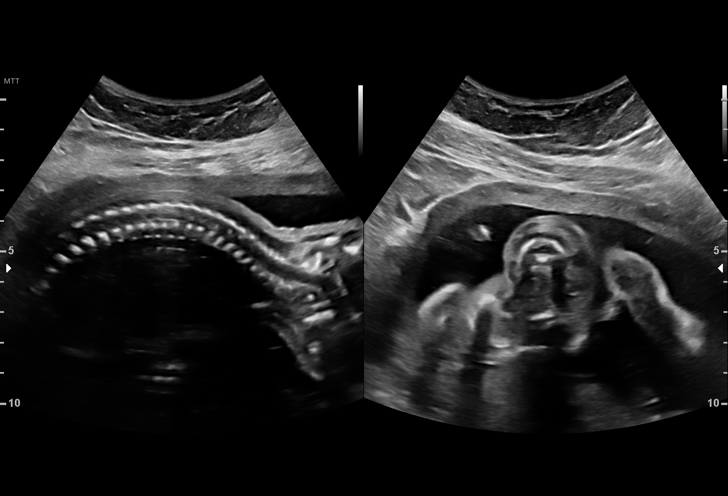
[im 67/83]
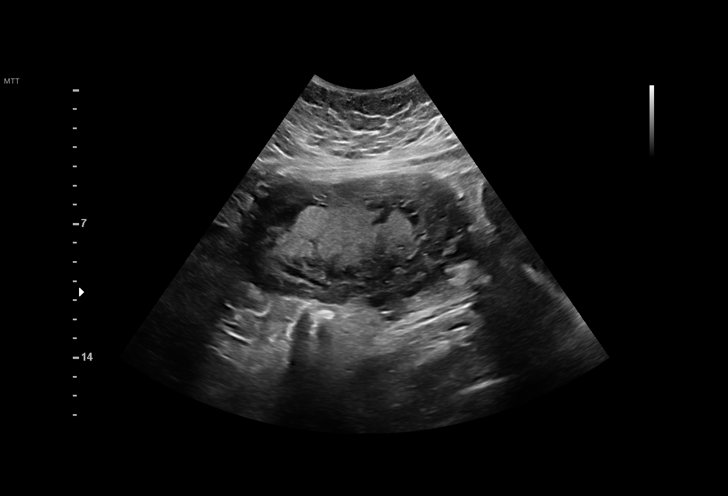
[im 73/83]
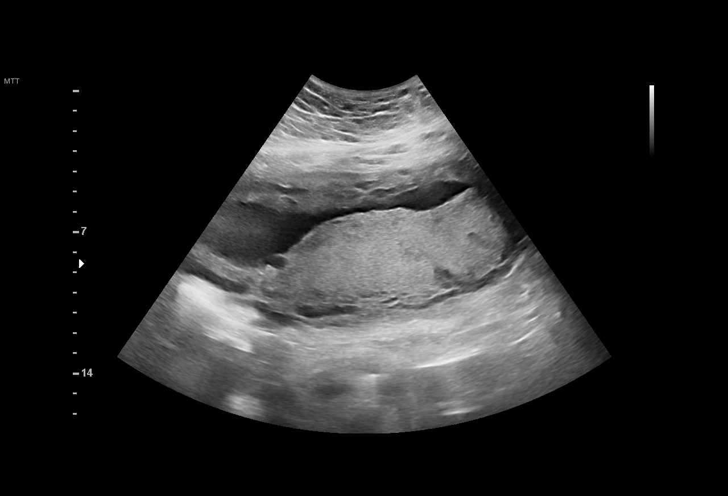
[im 79/83]
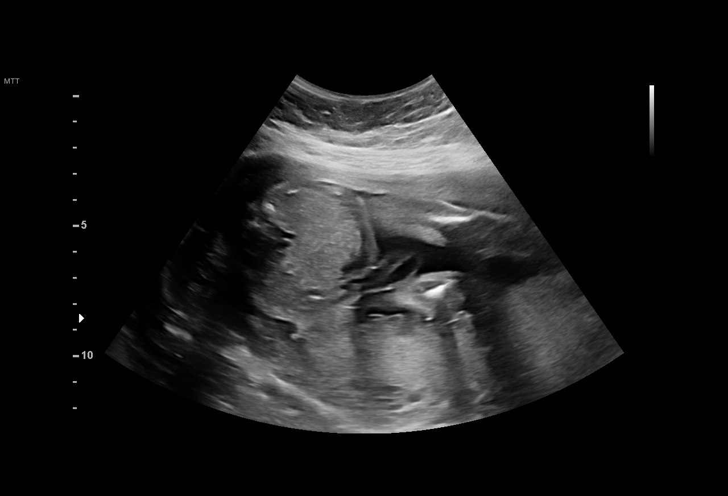

[13 of 28 positions shown; findings below may reference images not displayed]

----------------------------------------------------------------------

 ----------------------------------------------------------------------
Indications

  23 weeks gestation of pregnancy
  Obesity complicating pregnancy, second
  trimester (Pre Pregnancy BMI 35.6)
  Smoking complicating pregnancy, second
  trimester (0.5 pk/day)
  Encounter for antenatal screening for
  malformations
  Poor obstetrical history (cocaine use +
  08/12/18, pt denies replapse)
  Low risk NIPS
 ----------------------------------------------------------------------
Vital Signs

                                                Height:        5'5"
Fetal Evaluation

 Num Of Fetuses:         1
 Fetal Heart Rate(bpm):  146
 Cardiac Activity:       Observed
 Presentation:           Cephalic
 Placenta:               Posterior
 P. Cord Insertion:      Visualized, central

 Amniotic Fluid
 AFI FV:      Within normal limits

                             Largest Pocket(cm)

Biometry

 BPD:      53.5  mm     G. Age:  22w 2d         20  %    CI:        75.06   %    70 - 86
                                                         FL/HC:      18.8   %    19.2 -
 HC:      195.9  mm     G. Age:  21w 6d          4  %    HC/AC:      1.10        1.05 -
 AC:      177.8  mm     G. Age:  22w 5d         31  %    FL/BPD:     69.0   %    71 - 87
 FL:       36.9  mm     G. Age:  21w 5d          8  %    FL/AC:      20.8   %    20 - 24
 CER:      25.2  mm     G. Age:  23w 1d         53  %

 LV:        5.6  mm
 CM:          4  mm

 Est. FW:     485  gm      1 lb 1 oz     13  %
OB History

 Gravidity:    3         Term:   2
 Living:       2
Gestational Age

 LMP:           23w 0d        Date:  07/17/19                 EDD:   04/22/20
 U/S Today:     22w 1d                                        EDD:   04/28/20
 Best:          23w 0d     Det. By:  LMP  (07/17/19)          EDD:   04/22/20
Anatomy

 Cranium:               Appears normal         LVOT:                   Previously seen
 Cavum:                 Appears normal         Aortic Arch:            Appears normal
 Ventricles:            Appears normal         Ductal Arch:            Appears normal
 Choroid Plexus:        Appears normal         Diaphragm:              Appears normal
 Cerebellum:            Appears normal         Stomach:                Appears normal, left
                                                                       sided
 Posterior Fossa:       Appears normal         Abdomen:                Appears normal
 Nuchal Fold:           Previously seen        Abdominal Wall:         Appears nml (cord
                                                                       insert, abd wall)
 Face:                  Appears normal         Cord Vessels:           Appears normal (3
                        (orbits and profile)                           vessel cord)
 Lips:                  Appears normal         Kidneys:                Appear normal
 Palate:                Not well visualized    Bladder:                Appears normal
 Thoracic:              Appears normal         Spine:                  Appears normal
 Heart:                 Appears normal         Upper Extremities:      Previously seen
                        (4CH, axis, and
                        situs)
 RVOT:                  Appears normal         Lower Extremities:      Previously seen

 Other:  Female gender prev. vis., Open hands vis prev., LT heel and Bilat 5th
         digit vis. prev., NB prev. vis., 3VV visualized prev.  Technically difficult
         due to maternal habitus.
Cervix Uterus Adnexa

 Cervix
 Length:           4.47  cm.
 Normal appearance by transabdominal scan.

 Uterus
 No abnormality visualized.

 Left Ovary
 Within normal limits. No adnexal mass visualized.

 Right Ovary
 Within normal limits. No adnexal mass visualized.
 Cul De Sac
 No free fluid seen.

 Adnexa
 No abnormality visualized.
Impression

 Patient return for completion of fetal anatomy.  Fetal biometry
 is consistent with her previously established dates.  Amniotic
 fluid is normal and good fetal activity seen.  Fetal anatomical
 survey was completed and appears normal.
 Because of a history of smoking and substance use and the
 current estimated fetal weight at the 13th percentile, we
 recommend fetal growth assessment in 4 weeks.
Recommendations

 -An appointment was made for her to return in 4 weeks for
 fetal growth assessment.
                 Wester, Reinier

## 2022-03-21 IMAGING — US US MFM FETAL BPP W/O NON-STRESS
1 series · 14 of 28 positions shown · non-contrast
Comparison: none

[Series 1: us mfm fetal bpp w/o non-stress · 54 acquisitions, 14 frames shown]
[im 2/54]
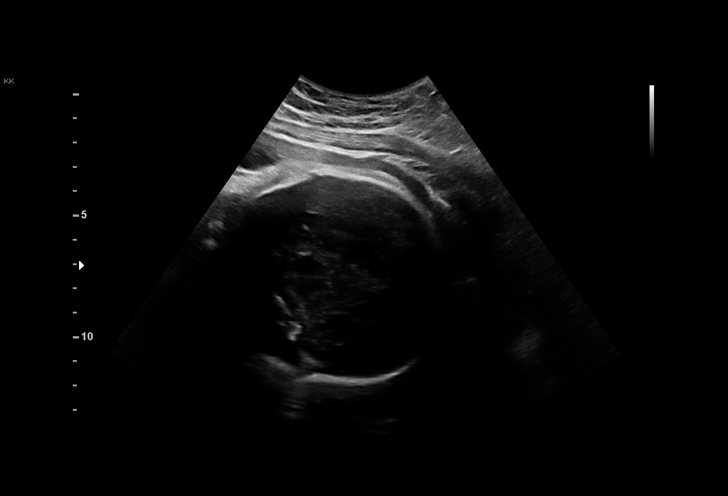
[im 6/54]
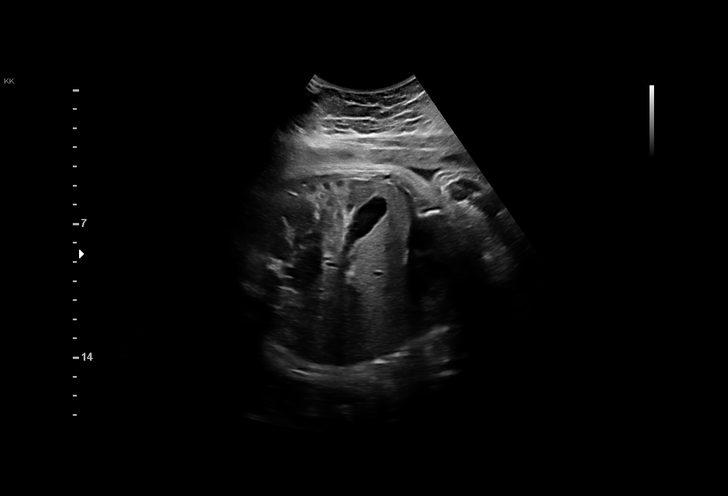
[im 10/54]
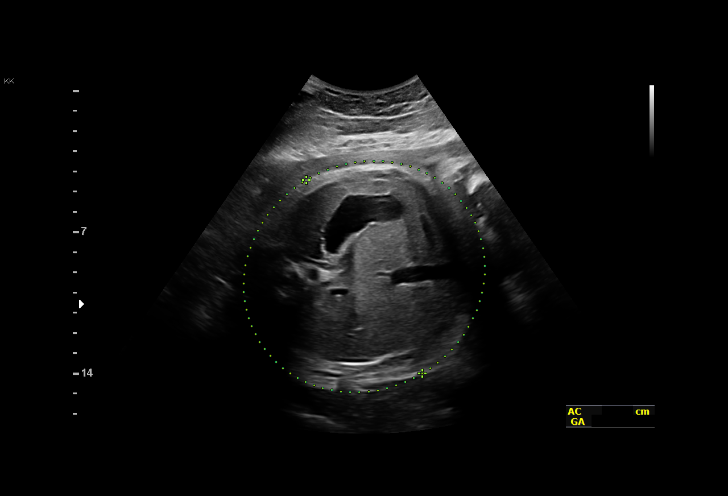
[im 14/54]
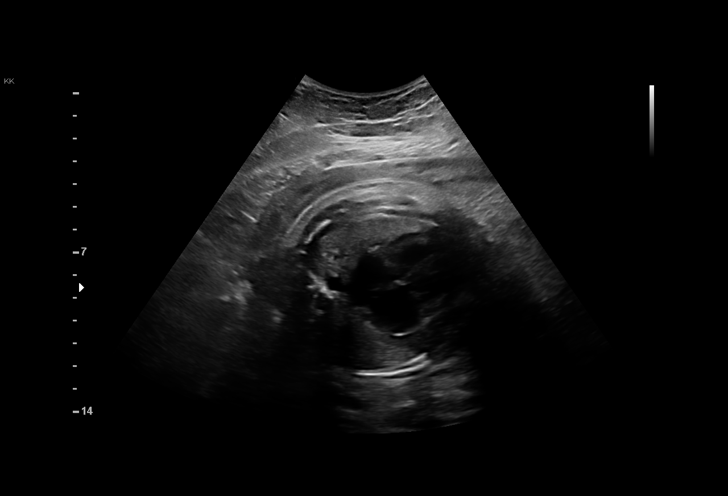
[im 18/54]
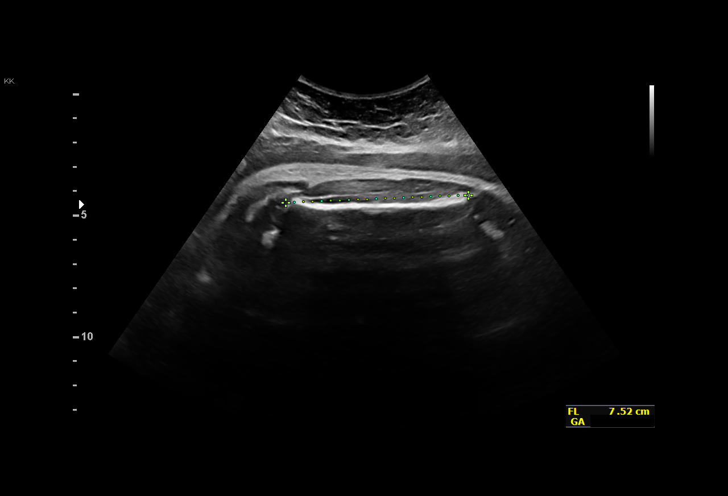
[im 22/54]
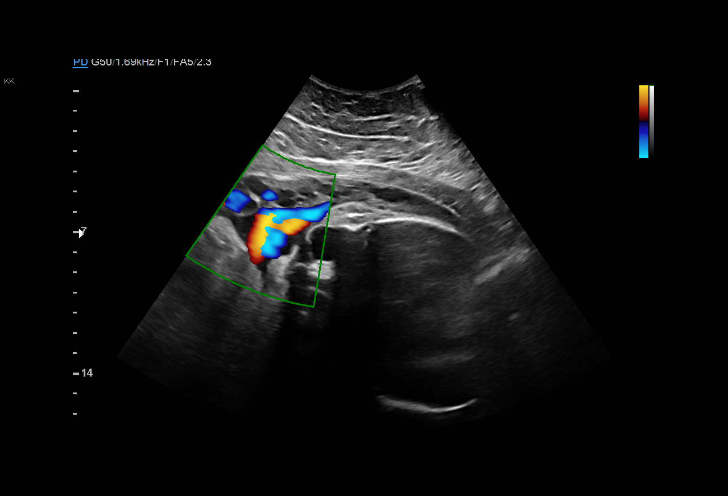
[im 26/54]
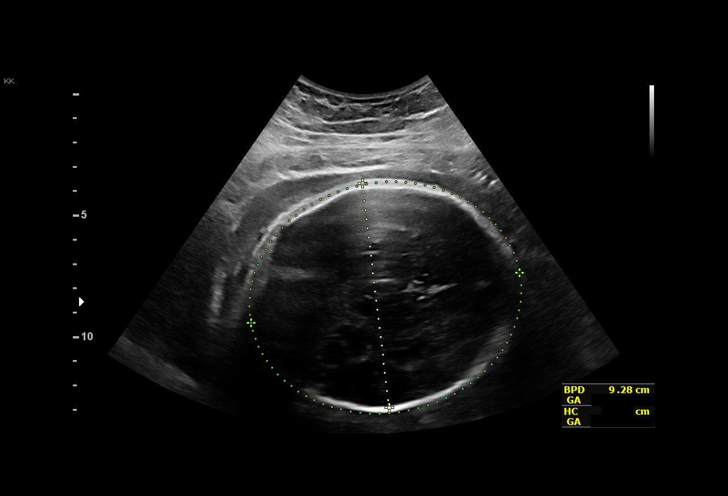
[im 30/54]
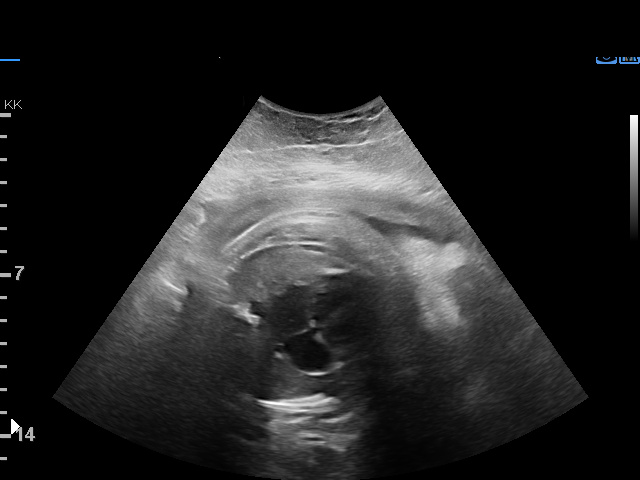
[im 34/54]
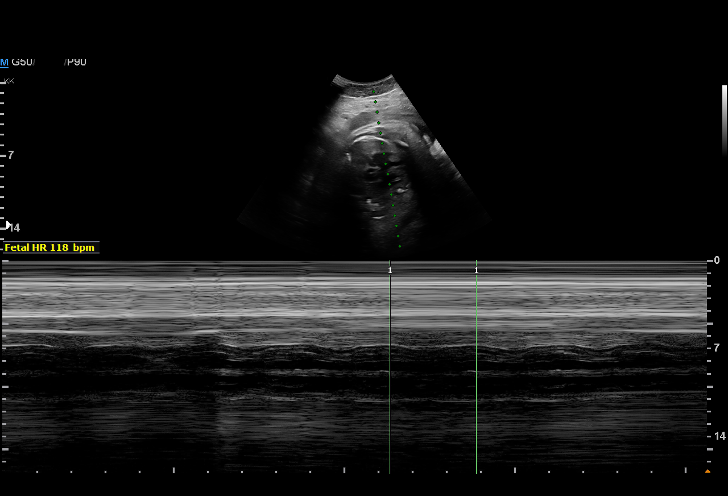
[im 38/54]
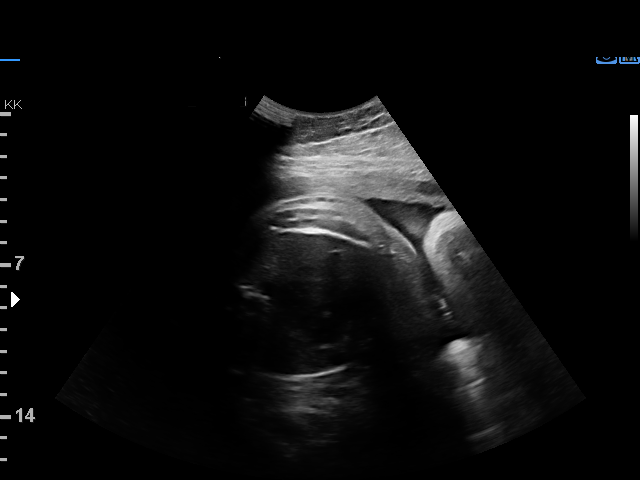
[im 42/54]
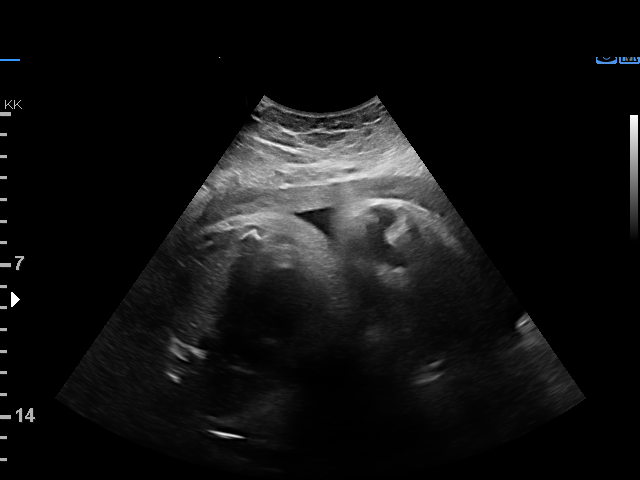
[im 46/54]
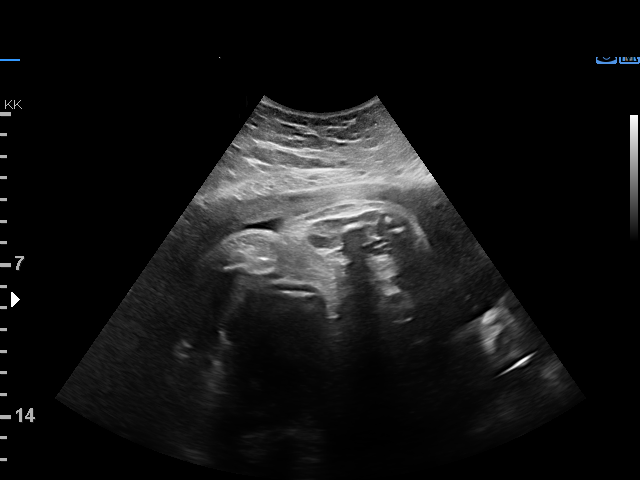
[im 50/54]
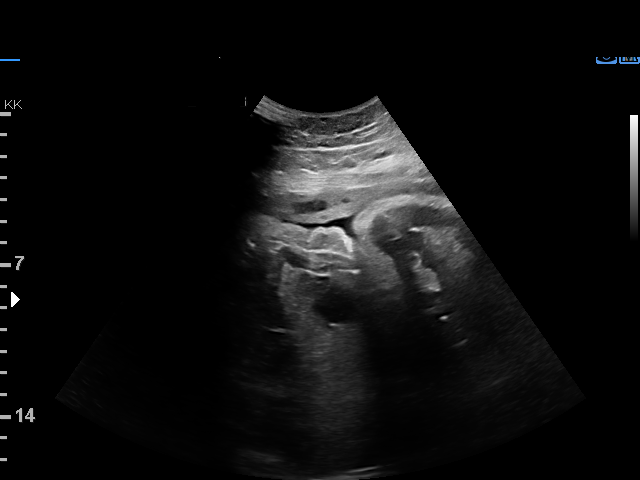
[im 54/54]
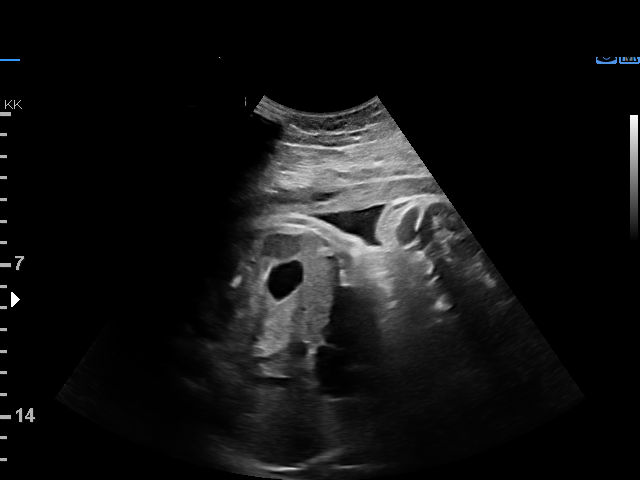

[14 of 28 positions shown; findings below may reference images not displayed]

Indications

 Postdate pregnancy (40-42 weeks)
 40 weeks gestation of pregnancy
 Obesity complicating pregnancy, third
 trimester
 Poor obstetrical history (cocaine use +
 08/12/18, pt denies replapse)
 Low risk NIPS
 Tobacco use complicating pregnancy, third
 trimester
Vital Signs

 BMI:
Fetal Evaluation

 Num Of Fetuses:         1
 Fetal Heart Rate(bpm):  118
 Cardiac Activity:       Observed
 Presentation:           Cephalic
 Placenta:               Posterior
 P. Cord Insertion:      Previously Visualized

 Amniotic Fluid
 AFI FV:      Within normal limits

 AFI Sum(cm)     %Tile       Largest Pocket(cm)
 6.5             3.5         3.

 RUQ(cm)       RLQ(cm)       LUQ(cm)        LLQ(cm)
 3
Biophysical Evaluation

 Amniotic F.V:   Pocket => 2 cm             F. Tone:        Observed
 F. Movement:    Observed                   Score:          [DATE]
 F. Breathing:   Not Observed
Biometry

 BPD:      92.3  mm     G. Age:  37w 3d         22  %    CI:        77.62   %    70 - 86
                                                         FL/HC:      22.5   %    20.7 -
 HC:      331.6  mm     G. Age:  37w 6d          6  %    HC/AC:      0.90        0.87 -
 AC:      369.5  mm     G. Age:  40w 6d         86  %    FL/BPD:     80.9   %    71 - 87
 FL:       74.7  mm     G. Age:  38w 1d         14  %    FL/AC:      20.2   %    20 - 24

 Est. FW:    2754  gm      8 lb 6 oz     60  %
OB History

 Gravidity:    3         Term:   2
 Living:       2
Gestational Age

 LMP:           40w 3d        Date:  07/17/19                 EDD:   04/22/20
 U/S Today:     38w 4d                                        EDD:   05/05/20
 Best:          40w 3d     Det. By:  LMP  (07/17/19)          EDD:   04/22/20
Anatomy

 Cranium:               Appears normal         Aortic Arch:            Previously seen
 Cavum:                 Previously seen        Ductal Arch:            Previously seen
 Ventricles:            Appears normal         Diaphragm:              Appears normal
 Choroid Plexus:        Previously seen        Stomach:                Appears normal, left
                                                                       sided
 Cerebellum:            Previously seen        Abdomen:                Appears normal
 Posterior Fossa:       Previously seen        Abdominal Wall:         Previously seen
 Nuchal Fold:           Previously seen        Cord Vessels:           Previously seen
 Face:                  Orbits and profile     Kidneys:                Appear normal
                        previously seen
 Lips:                  Previously seen        Bladder:                Appears normal
 Thoracic:              Appears normal         Spine:                  Previously seen
 Heart:                 Appears normal         Upper Extremities:      Previously seen
                        (4CH, axis, and
                        situs)
 RVOT:                  Previously seen        Lower Extremities:      Previously seen
 LVOT:                  Previously seen

 Other:  Female gender prev. vis., Open hands vis prev., LT heel and Bilat 5th
         digit vis. prev., NB prev. vis., 3VV visualized prev.  Technically difficult
         due to maternal habitus.
Cervix Uterus Adnexa

 Cervix
 Not visualized (advanced GA >38wks)
Impression

 pregnant.  Patient does not have gestational diabetes and her
 blood pressures have been within normal limits at prenatal
 visits.  Blood pressure today at her office is 128/81 mmHg.
 Obstetric history is significant for 2 previous term vaginal
 deliveries.
 Amniotic fluid is normal and good fetal activity is seen .Fetal
 growth is appropriate for gestational age .  Fetal breathing
 movements did not meet the criteria of BPP.
 BPP [DATE].

 I discussed the option of induction of labor.  If the patient
 prefers to delay induction we would perform NST at our
 office.  Patient would like to undergo induction of labor now.  I
 discussed with Dr. Tulio who recommended that the patient
 come to L&D for induction now.
 Patient will be going over to [id number]
                 Anser, Roshmi

## 2022-05-02 ENCOUNTER — Ambulatory Visit: Payer: Medicaid Other | Admitting: Student

## 2024-05-18 ENCOUNTER — Ambulatory Visit: Admitting: Obstetrics and Gynecology

## 2024-05-28 ENCOUNTER — Ambulatory Visit: Admitting: Obstetrics and Gynecology
